# Patient Record
Sex: Male | Born: 1935 | Race: Black or African American | Hispanic: No
Health system: Southern US, Community
[De-identification: ages and names within clinical notes are randomized; demographics above are authoritative.]

---

## 2004-02-29 ENCOUNTER — Emergency Department (HOSPITAL_COMMUNITY): Admission: EM | Admit: 2004-02-29 | Discharge: 2004-02-29 | Payer: Self-pay | Admitting: Emergency Medicine

## 2004-05-11 ENCOUNTER — Ambulatory Visit: Payer: Self-pay | Admitting: Family Medicine

## 2004-06-10 ENCOUNTER — Ambulatory Visit: Payer: Self-pay | Admitting: Family Medicine

## 2004-08-12 ENCOUNTER — Ambulatory Visit: Payer: Self-pay | Admitting: Family Medicine

## 2004-11-16 ENCOUNTER — Ambulatory Visit: Payer: Self-pay | Admitting: Family Medicine

## 2004-12-28 ENCOUNTER — Ambulatory Visit: Payer: Self-pay | Admitting: Family Medicine

## 2005-01-20 ENCOUNTER — Ambulatory Visit: Payer: Self-pay | Admitting: Internal Medicine

## 2005-02-15 ENCOUNTER — Ambulatory Visit: Payer: Self-pay | Admitting: Internal Medicine

## 2005-03-01 ENCOUNTER — Ambulatory Visit: Payer: Self-pay | Admitting: Family Medicine

## 2005-07-22 ENCOUNTER — Ambulatory Visit: Payer: Self-pay | Admitting: Family Medicine

## 2005-10-20 ENCOUNTER — Ambulatory Visit: Payer: Self-pay | Admitting: Family Medicine

## 2006-02-28 ENCOUNTER — Ambulatory Visit: Payer: Self-pay | Admitting: Family Medicine

## 2006-03-04 ENCOUNTER — Ambulatory Visit: Payer: Self-pay | Admitting: Family Medicine

## 2006-03-09 ENCOUNTER — Ambulatory Visit: Payer: Self-pay | Admitting: Family Medicine

## 2006-04-07 ENCOUNTER — Ambulatory Visit: Admission: RE | Admit: 2006-04-07 | Discharge: 2006-04-07 | Payer: Self-pay | Admitting: Family Medicine

## 2006-04-11 ENCOUNTER — Ambulatory Visit: Payer: Self-pay | Admitting: Family Medicine

## 2006-06-08 ENCOUNTER — Ambulatory Visit: Payer: Self-pay | Admitting: Family Medicine

## 2006-06-09 ENCOUNTER — Encounter: Payer: Self-pay | Admitting: Family Medicine

## 2006-06-09 LAB — CONVERTED CEMR LAB
Hgb A1c MFr Bld: 11.7 % — ABNORMAL HIGH (ref 4.6–6.1)
Microalb, Ur: 1.44 mg/dL (ref 0.00–1.89)
PSA: 1.44 ng/mL (ref 0.10–4.00)
TSH: 1.292 microintl units/mL (ref 0.350–5.50)

## 2006-06-10 ENCOUNTER — Ambulatory Visit: Payer: Self-pay | Admitting: Family Medicine

## 2006-07-20 ENCOUNTER — Ambulatory Visit: Payer: Self-pay | Admitting: Family Medicine

## 2006-09-06 ENCOUNTER — Ambulatory Visit: Payer: Self-pay | Admitting: Family Medicine

## 2006-09-06 LAB — CONVERTED CEMR LAB
BUN: 32 mg/dL — ABNORMAL HIGH (ref 6–23)
CO2: 22 meq/L (ref 19–32)
Calcium: 9 mg/dL (ref 8.4–10.5)
Chloride: 107 meq/L (ref 96–112)
Creatinine, Ser: 1.23 mg/dL (ref 0.40–1.50)
Glucose, Bld: 278 mg/dL — ABNORMAL HIGH (ref 70–99)
Hgb A1c MFr Bld: 11.3 % — ABNORMAL HIGH (ref 4.6–6.1)
Potassium: 4.5 meq/L (ref 3.5–5.3)
Sodium: 141 meq/L (ref 135–145)

## 2006-10-06 ENCOUNTER — Ambulatory Visit: Payer: Self-pay | Admitting: Family Medicine

## 2006-10-18 ENCOUNTER — Ambulatory Visit: Payer: Self-pay | Admitting: Family Medicine

## 2006-11-28 ENCOUNTER — Ambulatory Visit: Payer: Self-pay | Admitting: Family Medicine

## 2006-12-06 ENCOUNTER — Ambulatory Visit: Payer: Self-pay | Admitting: Family Medicine

## 2006-12-06 LAB — CONVERTED CEMR LAB
BUN: 49 mg/dL — ABNORMAL HIGH (ref 6–23)
CO2: 27 meq/L (ref 19–32)
Calcium: 9.2 mg/dL (ref 8.4–10.5)
Chloride: 105 meq/L (ref 96–112)
Creatinine, Ser: 1.49 mg/dL (ref 0.40–1.50)
Glucose, Bld: 204 mg/dL — ABNORMAL HIGH (ref 70–99)
Potassium: 4.6 meq/L (ref 3.5–5.3)
Sodium: 139 meq/L (ref 135–145)

## 2007-01-31 ENCOUNTER — Ambulatory Visit: Payer: Self-pay | Admitting: Family Medicine

## 2007-02-01 ENCOUNTER — Encounter: Payer: Self-pay | Admitting: Family Medicine

## 2007-02-01 LAB — CONVERTED CEMR LAB
ALT: 24 units/L (ref 0–53)
AST: 19 units/L (ref 0–37)
Albumin: 3.8 g/dL (ref 3.5–5.2)
Alkaline Phosphatase: 88 units/L (ref 39–117)
BUN: 41 mg/dL — ABNORMAL HIGH (ref 6–23)
Basophils Absolute: 0 10*3/uL (ref 0.0–0.1)
Basophils Relative: 0 % (ref 0–1)
Bilirubin, Direct: 0.1 mg/dL (ref 0.0–0.3)
CO2: 23 meq/L (ref 19–32)
Calcium: 8.8 mg/dL (ref 8.4–10.5)
Chloride: 107 meq/L (ref 96–112)
Cholesterol: 121 mg/dL (ref 0–200)
Creatinine, Ser: 1.49 mg/dL (ref 0.40–1.50)
Eosinophils Absolute: 0.3 10*3/uL (ref 0.0–0.7)
Eosinophils Relative: 3 % (ref 0–5)
Glucose, Bld: 230 mg/dL — ABNORMAL HIGH (ref 70–99)
HCT: 44 % (ref 39.0–52.0)
HDL: 43 mg/dL (ref >39)
Hemoglobin: 13.6 g/dL (ref 13.0–17.0)
Indirect Bilirubin: 0.5 mg/dL (ref 0.0–0.9)
LDL Cholesterol: 65 mg/dL (ref 0–99)
Lymphocytes Relative: 23 % (ref 12–46)
Lymphs Abs: 2.3 10*3/uL (ref 0.7–3.3)
MCHC: 30.9 g/dL (ref 30.0–36.0)
MCV: 88.2 fL (ref 78.0–100.0)
Monocytes Absolute: 0.9 10*3/uL — ABNORMAL HIGH (ref 0.2–0.7)
Monocytes Relative: 9 % (ref 3–11)
Neutro Abs: 6.6 10*3/uL (ref 1.7–7.7)
Neutrophils Relative %: 66 % (ref 43–77)
Platelets: 272 10*3/uL (ref 150–400)
Potassium: 4.2 meq/L (ref 3.5–5.3)
RBC: 4.99 M/uL (ref 4.22–5.81)
RDW: 17.2 % — ABNORMAL HIGH (ref 11.5–14.0)
Sodium: 142 meq/L (ref 135–145)
Total Bilirubin: 0.6 mg/dL (ref 0.3–1.2)
Total CHOL/HDL Ratio: 2.8
Total Protein: 7.7 g/dL (ref 6.0–8.3)
Triglycerides: 65 mg/dL (ref <150)
VLDL: 13 mg/dL (ref 0–40)
WBC: 10.1 10*3/uL (ref 4.0–10.5)

## 2007-03-15 ENCOUNTER — Ambulatory Visit: Payer: Self-pay | Admitting: Family Medicine

## 2007-03-28 ENCOUNTER — Ambulatory Visit: Payer: Self-pay | Admitting: Family Medicine

## 2007-05-04 ENCOUNTER — Ambulatory Visit: Payer: Self-pay | Admitting: Family Medicine

## 2007-05-22 ENCOUNTER — Ambulatory Visit: Payer: Self-pay | Admitting: Family Medicine

## 2007-06-07 ENCOUNTER — Inpatient Hospital Stay (HOSPITAL_COMMUNITY): Admission: EM | Admit: 2007-06-07 | Discharge: 2007-06-12 | Payer: Self-pay | Admitting: Emergency Medicine

## 2007-06-09 ENCOUNTER — Ambulatory Visit: Payer: Self-pay | Admitting: Gastroenterology

## 2007-06-10 ENCOUNTER — Ambulatory Visit: Payer: Self-pay | Admitting: Gastroenterology

## 2007-06-27 ENCOUNTER — Inpatient Hospital Stay (HOSPITAL_COMMUNITY): Admission: EM | Admit: 2007-06-27 | Discharge: 2007-07-07 | Payer: Self-pay | Admitting: Emergency Medicine

## 2007-07-01 ENCOUNTER — Ambulatory Visit: Payer: Self-pay | Admitting: Internal Medicine

## 2007-07-03 ENCOUNTER — Encounter: Payer: Self-pay | Admitting: Internal Medicine

## 2007-07-03 ENCOUNTER — Ambulatory Visit: Payer: Self-pay | Admitting: Internal Medicine

## 2007-11-07 ENCOUNTER — Ambulatory Visit: Payer: Self-pay | Admitting: Family Medicine

## 2007-11-07 LAB — CONVERTED CEMR LAB
BUN: 22 mg/dL (ref 6–23)
Basophils Absolute: 0.1 10*3/uL (ref 0.0–0.1)
Basophils Relative: 1 % (ref 0–1)
CO2: 25 meq/L (ref 19–32)
Calcium: 9.4 mg/dL (ref 8.4–10.5)
Chloride: 98 meq/L (ref 96–112)
Creatinine, Ser: 0.85 mg/dL (ref 0.40–1.50)
Eosinophils Absolute: 0.3 10*3/uL (ref 0.0–0.7)
Eosinophils Relative: 4 % (ref 0–5)
Glucose, Bld: 155 mg/dL — ABNORMAL HIGH (ref 70–99)
HCT: 34.9 % — ABNORMAL LOW (ref 39.0–52.0)
Hemoglobin: 10.9 g/dL — ABNORMAL LOW (ref 13.0–17.0)
Lymphocytes Relative: 18 % (ref 12–46)
Lymphs Abs: 1.3 10*3/uL (ref 0.7–4.0)
MCHC: 31.2 g/dL (ref 30.0–36.0)
MCV: 73.9 fL — ABNORMAL LOW (ref 78.0–100.0)
Monocytes Absolute: 0.6 10*3/uL (ref 0.1–1.0)
Monocytes Relative: 8 % (ref 3–12)
Neutro Abs: 5.1 10*3/uL (ref 1.7–7.7)
Neutrophils Relative %: 69 % (ref 43–77)
Platelets: 505 10*3/uL — ABNORMAL HIGH (ref 150–400)
Potassium: 4.6 meq/L (ref 3.5–5.3)
RBC: 4.72 M/uL (ref 4.22–5.81)
RDW: 22.3 % — ABNORMAL HIGH (ref 11.5–15.5)
Sodium: 133 meq/L — ABNORMAL LOW (ref 135–145)
WBC: 7.4 10*3/uL (ref 4.0–10.5)

## 2007-11-10 DIAGNOSIS — E785 Hyperlipidemia, unspecified: Secondary | ICD-10-CM | POA: Insufficient documentation

## 2007-11-10 DIAGNOSIS — D539 Nutritional anemia, unspecified: Secondary | ICD-10-CM | POA: Insufficient documentation

## 2007-11-10 DIAGNOSIS — I10 Essential (primary) hypertension: Secondary | ICD-10-CM | POA: Insufficient documentation

## 2007-11-10 DIAGNOSIS — N189 Chronic kidney disease, unspecified: Secondary | ICD-10-CM | POA: Insufficient documentation

## 2007-11-10 DIAGNOSIS — E669 Obesity, unspecified: Secondary | ICD-10-CM | POA: Insufficient documentation

## 2007-11-10 DIAGNOSIS — E109 Type 1 diabetes mellitus without complications: Secondary | ICD-10-CM | POA: Insufficient documentation

## 2008-04-04 IMAGING — CR DG CHEST 1V PORT
1 series · 1 of 1 positions shown · non-contrast
Comparison: 06/09/07.

CLINICAL DATA: Hypoglycemia.  
 PORTABLE CHEST ? 1 VIEW:

[view not recorded]
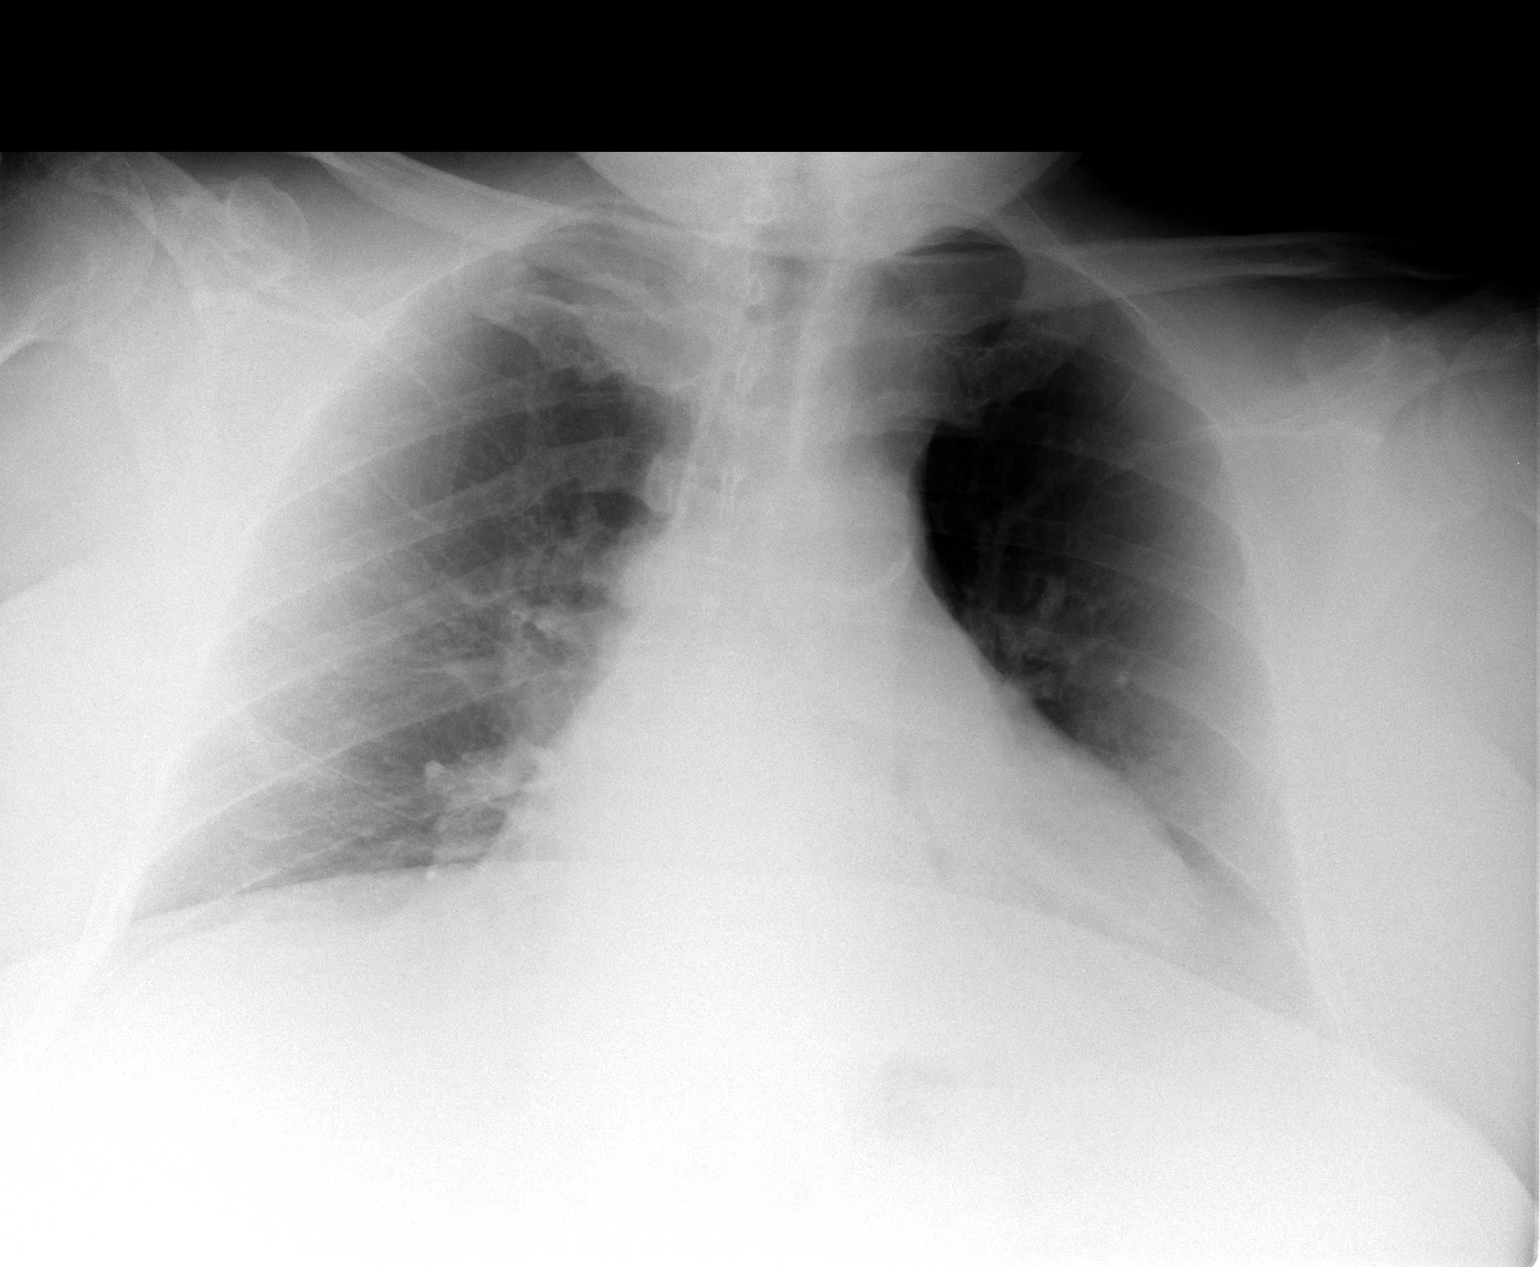

[1 of 1 positions shown; findings below may reference images not displayed]

FINDINGS: The patient is somewhat lordotic in position.  The heart is mildly enlarged considering AP projection.  There is no frank congestive heart failure or pneumonia in one view.
IMPRESSION: Cardiomegaly ? no definite acute process.

## 2008-04-05 IMAGING — CR DG CHEST 1V PORT SAME DAY
1 series · 1 of 1 positions shown · non-contrast
Comparison: Same day.

CLINICAL DATA: PICC placement.
 PORTABLE CHEST:

[view not recorded]
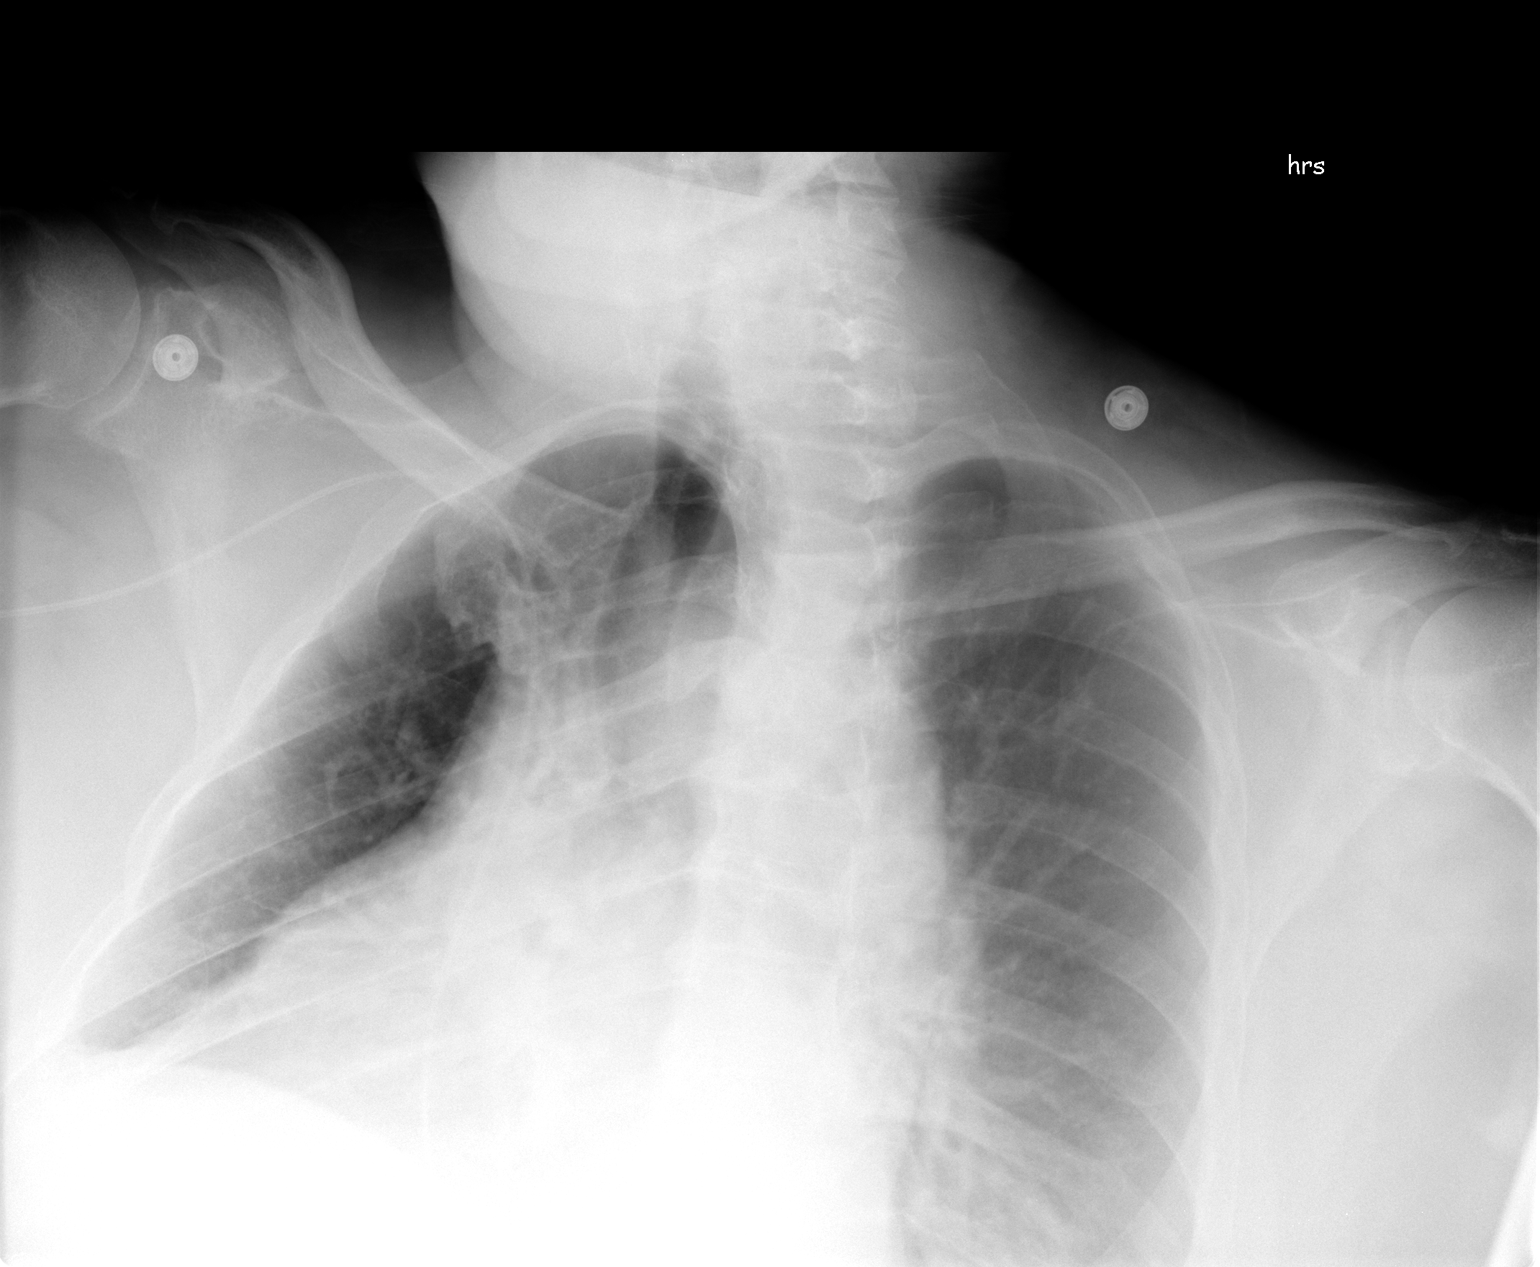

[1 of 1 positions shown; findings below may reference images not displayed]

FINDINGS: Right PICC extends beyond the inferior boundary of the film.  Left costophrenic angle is not included on the film.  There may be a left pleural effusion.
IMPRESSION: Right PICC extends beyond the inferior boundary of the film.  Left pleural effusion.

## 2008-04-05 IMAGING — CR DG CHEST 1V PORT SAME DAY
1 series · 1 of 1 positions shown · non-contrast
Comparison: 06/28/07

CLINICAL DATA: PICC line placement. 
 PORTABLE CHEST - 1 VIEW ? 06/28/07 AT 1677 HOURS:

[view not recorded]
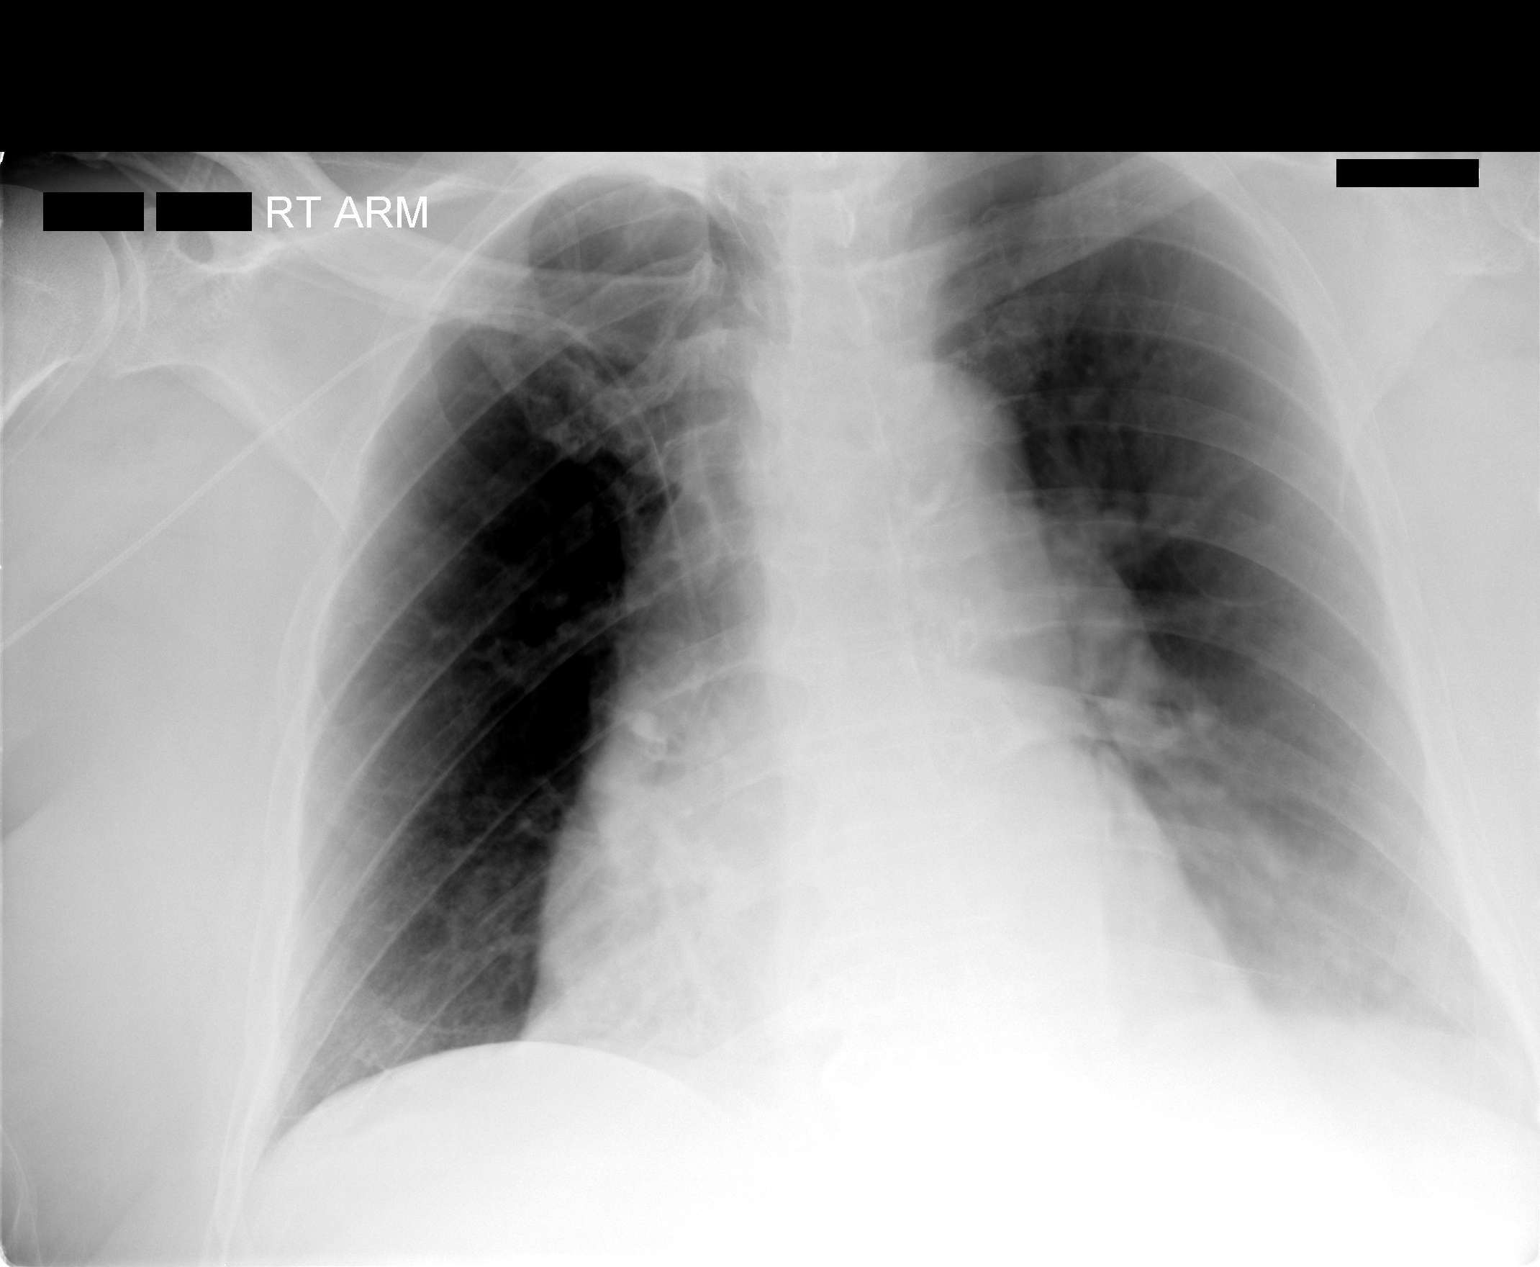

[1 of 1 positions shown; findings below may reference images not displayed]

FINDINGS: Compared to an earlier film, the PICC line has been withdrawn with the tip now in the mid SVC.  No pneumothorax is seen.  Heart size is stable.
IMPRESSION: Right PICC tip in mid SVC.  No pneumothorax.

## 2010-10-13 NOTE — Op Note (Signed)
NAME:  [name] Olsen, [name] Olsen NO.:  [id number]   MEDICAL RECORD NO.:  [id number]          PATIENT TYPE:  INP   LOCATION:  A326                          FACILITY:  [facility]   PHYSICIAN:  R. Roetta Sessions, M.D. DATE OF BIRTH:  11-23-1926   DATE OF PROCEDURE:  07/03/2007  DATE OF DISCHARGE:                               OPERATIVE REPORT   Esophagogastroduodenoscopy with esophageal biopsy, KOH prep followed by  colonoscopy, biopsy.   INDICATIONS FOR PROCEDURE:  77 year old gentleman numerous medical  problems been in hospital with anemia and Hemoccult positive stool.  He  is anorexic.  EGD colonoscopy now being done.  This approach has been  discussed the patient at length previously and again at the bedside.  Risks, benefits and alternatives have been reviewed.  Please see  documentation medical record.  Please see Dr. Cira Servant January consultation  for more information.   PROCEDURE NOTE:  This was a technically challenging exam.  The patient  was not mobile and was in a bariatric bed.  Conscious sedation Versed 2  mg IV.  Both procedures Pentax video chip system.  Cetacaine spray for  topical pharyngeal anesthesia.  The patient was placed lateral decubitus  position.   EGD FINDINGS:  Examination of the tubular esophagus revealed two-thirds  of the distal esophagus denuded markedly inflamed friable.  There was no  normal mucosa.  Distal 2/3 tubular esophagus there was a fairly well  demarcated zone at approximately where the proximal 1/3 appeared normal.  There was some minimal exudate but no obvious plaques.  Please see  multiple photos taken, no obvious tumor.  EG junction easily traversed.  Stomach:  Gastric cavity was emptied insufflated well with air.  Thorough examination gastric mucosa including retroflex view of proximal  stomach esophagogastric junction demonstrated only a small hiatal  hernia.  Pylorus patent, easily traversed.  Examination bulb, second  portion  revealed no abnormalities.   THERAPEUTIC/DIAGNOSTIC MANEUVERS PERFORMED:  KOH prep esophageal mucosa  were taken also biopsies for histology were taken.   The patient tolerated the procedure well was prepared for colonoscopy.  Again this was a challenge because of lack of mobility and redundancy of  the colon.  Digital rectal exam revealed no abnormalities.  Prep was  marginal to poor.   FINDINGS:  Examination of the colon was undertaken from the rectosigmoid  junction through the left, transverse, right colon to area of  appendiceal orifice, ileocecal valve and cecum.  These structures were  seen and photographed for the record.  From this level scope was  withdrawn.  Previously mentioned mucosal surfaces were again seen.  The  patient's colon was elongated and redundant, external abdominal pressure  had to be applied to reach the cecum.  The area about the cecum,  ileocecal valve was ulcerated.  Mucosa was partially obscured by stool.  I did not see any neoplastic process.  This area was biopsied.  The  patient had extensive left-sided diverticula.  I did not see any obvious  polyp or neoplasm.  Poor prep compromised exam.  Nor did I see any  ulcers in the  more distal colon down the rectosigmoid junction.  However, the rectosigmoid, numerous ulcers were identified.  The largest  that I saw was approximately 1 cm.  Please see photos.  Because of the  angle of the patient in the low part of bariatric bed I was unable to  retroflex but I was able to biopsy one of the ulcers in the rectum as  well.  The patient tolerated both procedures very well was reacted in  endoscopy.   IMPRESSION:  1. EGD markedly inflamed distal 2/3 of tubular esophagus as described      above, consistent with severe esophagitis, possibly reflux.  We      will rule out of viral and fungal esophagitis less likely, biopsied      KOH pending.  2. Hiatal hernia otherwise normal gastric mucosa, normal D1, D2.    Colonoscopy findings:  Poor prep patient positioning and bed made exam  much more difficult, the patient a long redundant colon.  He had ulcers  in the rectum and about the cecum, ileocecal valve of uncertain  significance biopsied.  Left-sided diverticula.  No obvious polyp or  neoplasm.  Exam was compromised by the poor prep and technical  difficulties as outlined above.   RECOMMENDATIONS:  1. Increase Protonix to 40 grams orally every 12 hours.  2. Add Carafate 1 gram slurry q.i.d.  3. Follow-up on path.  4. Allow carbohydrate modified low residue renal diet.   I discussed my findings with the patient's son, will make further  recommendations in the very near future.      Jonathon Bellows, M.D.  Electronically Signed     RMR/MEDQ  D:  07/03/2007  T:  07/04/2007  Job:  [id number]   cc:   Jorja Loa, M.D.  Fax: [phone]   hospitalist team

## 2010-10-13 NOTE — Group Therapy Note (Signed)
NAME:  [name] Olsen, [name] Olsen NO.:  [id number]   MEDICAL RECORD NO.:  [id number]          PATIENT TYPE:  INP   LOCATION:  A203                          FACILITY:  [facility]   PHYSICIAN:  Skeet Latch, DO    DATE OF BIRTH:  1927-04-03   DATE OF PROCEDURE:  06/11/2007  DATE OF DISCHARGE:                                 PROGRESS NOTE   SUBJECTIVE:  [name] Olsen seems to be doing well and improving at this  time.  Patient does not complain of any shortness of breath, chest pain,  abdominal or genitourinary complaints today.  Patient wanting to be  discharged.   OBJECTIVE:  VITAL SIGNS:  Temperature 98.5, pulse 74, respirations 24,  blood pressure 149/64.  LUNGS:  Clear to auscultation bilaterally.  No wheezing, rhonchi or  rales.  CARDIOVASCULAR:  Regular rate and rhythm, no murmurs, rubs, or gallops.  ABDOMEN:  Obese, soft, nontender, nondistended, positive bowel sounds.  No rigidity or guarding.  EXTREMITIES:  Chronic changes noted in lower extremities with pedal  edema.   LABORATORY DATA:  Labs are pending at this time.   ASSESSMENT:  1. Renal failure, acute on chronic.  His renal function seems to be      improving daily.  Patient continues to be on IV hydration and      continues to be on Lasix.  I will change his Lasix to p.o. at this      time.  His renal ultrasound did show bilateral hydronephrosis.  2. Hyperkalemia.  Seemed to have resolved.  His potassium seems to be      stable at this time.  3. Metabolic acidosis.  This is also improving.  Patient still on      bicarb orally at this time.  4. Normocytic anemia.  Patient was found to be iron deficient and iron      supplement was begun yesterday.  Patient is also Hemoccult positive      and has been followed by gastroenterology.  They recommended an      outpatient colonoscopy/esophagogastroduodenoscopy.  Patient's      Lovenox has been discontinued and he has been placed on compression      devices.   Anticipate patient being discharged in the next 24 hours if stable.      Skeet Latch, DO  Electronically Signed     SM/MEDQ  D:  06/11/2007  T:  06/11/2007  Job:  [id number]

## 2010-10-13 NOTE — Procedures (Signed)
NAME:  JARL, SELLITTO NO.:  [id number]   MEDICAL RECORD NO.:  [id number]          PATIENT TYPE:  INP   LOCATION:  A203                          FACILITY:  [facility]   PHYSICIAN:  Dani Gobble, MD       DATE OF BIRTH:  [birth year]   DATE OF PROCEDURE:  06/08/2007  DATE OF DISCHARGE:                                ECHOCARDIOGRAM   INDICATIONS:  A 77 year old gentleman with a past medical history of  hypertension and diabetes, admitted with weakness and acute renal  failure and referred for evaluation of LV function.   The technical quality of this study is severely limited secondary to  patient body habitus, tachycardic heart rate, ectopy and bundle branch  block.  My recommendation would be to repeat this once his heart rate is  slower and a bit less chaotic.   The aorta is not well visualized.   The left atrium appears grossly normal in size.  The patient was in mild  sinus tachycardia with frequent ectopic beats and bundle branch block.   None of the valves were well visualized, but no gross abnormality was  noted.  The left ventricular size appeared to be normal.  Comment  regarding systolic function and ejection fraction is quite difficult due  to the technical limitations list above.  I do not suspect severe left  ventricular dysfunction, but having said that, it would be difficult to  give an estimate of the ejection fraction on this study.  The right side  was not well visualized.  There does appear to be a small  circumferential pericardial effusion versus thickening of the  pericardium.  I do not suspect tamponade hemodynamics, although not all  parameters obviously were able to be assessed.  Again, I would recommend  a repeat study once his heart rate is diminished and ectopy is less.           ______________________________  Dani Gobble, MD     AB/MEDQ  D:  06/08/2007  T:  06/08/2007  Job:  [id number]

## 2010-10-13 NOTE — H&P (Signed)
NAME:  [name] Olsen, RACHAL NO.:  [id number]   MEDICAL RECORD NO.:  [id number]          PATIENT TYPE:  INP   LOCATION:  A326                          FACILITY:  [facility]   PHYSICIAN:  Skeet Latch, DO    DATE OF BIRTH:  1926-07-12   DATE OF ADMISSION:  06/27/2007  DATE OF DISCHARGE:  LH                              HISTORY & PHYSICAL   PRIMARY CARE PHYSICIAN:  Dr. Lodema Hong.   CHIEF COMPLAINT:  1. Hypoglycemia.  2. Lower extremity edema.   HISTORY OF PRESENT ILLNESS:  [name] Olsen is a 77 year old African-  American male who was recently discharged on June 12, 2007, for renal  failure, anemia, leukocytosis, pedal edema, elevated troponins.  Patient  presents with family members who state that he was found this morning to  have a blood sugar of 30 and be slightly unresponsive with slurred  speech.  Also of note, family members state for the past week or so the  patient has been having increasing edema in his lower extremities and  having skin breakdown on his legs and his sacral area.   EXAM:  Patient is sleeping but he is easily arousable, does not appear  in any acute distress.  Patient does have some type of home health  caregiver that comes on a daily basis and states that over the past week  patient's edema has worsened and he has been complaining of back pain.   PAST MEDICAL HISTORY:  1. Hypertension.  2. Diabetes.  3. Hyperlipidemia.  4. Obesity.  5. Osteoarthritis.  6. Chronic renal failure.  7. Anemia.  8. Chronic pedal edema.  9. Elevated troponin.   PAST SURGICAL HISTORY:  None.   SOCIAL HISTORY:  Denies any alcohol, tobacco or illicit drug use.   ALLERGIES:  No known drug allergies.   REVIEW OF SYSTEMS:  HEENT:  Unremarkable.  RESPIRATORY:  No cough,  dyspnea or wheezing.  GI:  No nausea, vomiting, diarrhea.  GENITOURINARY:  No urgency, dysuria or frequency.  MUSCULOSKELETAL:  Complains of back pain and leg pain.  SKIN:  Positive for edema  with  skin breakdown.  NEUROLOGIC:  No headaches, no mental status changes.  CARDIOVASCULAR:  No chest pain or palpitations.   CURRENT HOME MEDICATIONS:  1. Byetta 10 units subcu at breakfast and supper.  2. Humulin 70/30, 75 units in the morning, 40 units in the p.m.  3. Ferrous sulfate 325 mg daily.  4. Simvastatin 40 mg at bedtime.  5. Cyclobenzaprine 10 mg __________ .  6. Furosemide 20 mg twice a day.  7. Diltiazem 120 mg daily.  8. Glimepiride 4 mg daily.  9. Aspirin 81 mg daily.  10.Benazepril HCT 20/12.5, two tabs daily.   PHYSICAL EXAM:  VITAL SIGNS:  Temperature is 94.5, pulse 80,  respirations 20, blood pressure 90/57, sating 97%.  HEENT:  Head is atraumatic, normocephalic.  Eyes are PERRLA, EOMI.  NECK:  Soft, supple, nontender, nondistended, no JVD, no thyromegaly.  CARDIOVASCULAR:  Distant heart sounds, S1 and S2 is regular, no murmurs.  LUNGS:  Decreased but clear.  No rales, rhonchi or wheezing.  ABDOMEN:  Obese, soft, nontender, nondistended, positive bowel sounds,  no rigidity or guarding.  EXTREMITIES:  He has bilateral chronic changes noted with severe pedal  edema in his lower extremities and feet.  He has multiple skin  breakdowns noted.  Left lower extremity has fluctuant areas of edema  noted, has onychomycosis on his toes.  SKIN:  His buttock area, sacral area has multiple skin breakdowns in  chronic stages of healing.  Some serosanguineous fluid is noted on his  sacral area as well as his lower extremities.  NEUROLOGICALLY:  Cranial nerves II-XII are grossly intact.  Patient  sleeping but alert.   LABS:  His urine microscopic 11-20 WBCs, 7-10 RBCs.  Urinalysis is  clear, a small amount of leukocytes, no nitrates.  Sodium 133, potassium  5.1, chloride 100, CO2 is 19, glucose 71.  BUN 143, creatinine 5.4.  White count 13.5, hemoglobin 10.1, hematocrit 30.7, platelet count  381,000.   ASSESSMENT:  1. Cellulitis with skin breakdown.  2. Chronic pedal  edema.  3. Acute on chronic renal failure.  4. Diabetes.  5. Hyponatremia.  6. Leukocytosis.  7. Anemia.   PLAN:  1. For his cellulitis with skin breakdown, the patient was empirically      placed on vancomycin, Pharmacy to dose.  Patient will also get a      Wound Care consult regarding multiple skin breakdowns to prevent      sacral decubitus and to treat his leg wounds.  Will await cultures      at this time.  2. For his acute on chronic renal failure will get a Nephrology      consult.  Patient was followed by Nephrology on a previous visit.  3. For his diabetes patient will be placed on a sliding scale, blood      sugars will be checked before meals and on retiring.  Will hold on      his oral hypoglycemics at this time.  4. For his leukocytosis will continue to follow.  Will await culture      results.  5. For his anemia it seems to be chronic in nature.  Will just      continue to follow his hemoglobin and hematocrits.  6. For his pedal edema, this is chronic in nature.  Will add a BNP to      his a.m. labs.  The patient has no history of CHF, will rule out      that with a BNP and a 2D echo.      Skeet Latch, DO  Electronically Signed     SM/MEDQ  D:  06/27/2007  T:  06/27/2007  Job:  [id number]

## 2010-10-13 NOTE — Discharge Summary (Signed)
NAME:  [name] Olsen, [name] Olsen NO.:  [id number]   MEDICAL RECORD NO.:  [id number]          PATIENT TYPE:  INP   LOCATION:  A326                          FACILITY:  [facility]   PHYSICIAN:  Dorris Singh, DO    DATE OF BIRTH:  11/15/1926   DATE OF ADMISSION:  06/27/2007  DATE OF DISCHARGE:  02/06/2009LH                               DISCHARGE SUMMARY   ADMISSION DIAGNOSES:  1. Cellulitis with skin breakdown.  2. Chronic pedal edema.  3. Acute on chronic renal failure.  4. Diabetes.  5. Hyponatremia.  6. Leukocytosis.  7. Anemia.   DISCHARGE DIAGNOSES:  1. Cellulitis with skin breakdown.  2. Sacral decubitus ulcers.  3. Chronic pedal edema with elephantiasis.  4. Chronic renal failure.  5. Diabetes.  6. Anemia.   PRIMARY CARE PHYSICIAN:  Dr. Lodema Hong.   CONSULTATIONS:  He had several consults which include Dr. Jena Gauss, Dr.  Kristian Covey, physical therapy, and Dr. Malvin Johns.   PROCEDURES:  He had several tests done.  One includes on January 27  portable chest which showed cardiomegaly, on January 28 he had another  portable chest with a rotating exam with question of bibasilar  infiltrate to the right arm PICC line and right subclavian vein, and on  January 28 also he had a chest port view, right PICC line and the mid  subclavian, no pneumothorax, and had a repeat which showed right PICC  line extends beyond the inferior boundary of the film, left pleural  effusion.  He had an operative report for an EGD on February 2.  The  patient was also seen by wound care.   His H&P was done by Dr. Lilian Kapur.  To summarize, [name] Olsen is a 77-  year-old African-American male who was recently discharged on January 12  for renal failure nemo leukocytosis and pedal edema and elevated  troponin.  He was found this morning to have a blood sugar of 30 and to  be slightly unresponsive, with slurred speech.  The patient has also  been complaining of increasing edema in his skin in his  lower  extremities and having breakdown in his lower legs and sacral area.  He  has not been mobile.  He was admitted for cellulitis of lower  extremities with skin breakdown.  He was placed on Vancomycin.  The  patient also was seen by wound care for is multiple breakdowns and to  prevent sacral decubitus ulcers.  He also has chronic renal failure.  Due to his renal failure, the patient was seen by nephrology.  Also he  was placed on a sliding scale and he was continued on antibiotics.  The  patient then was seen by several consultants.  The patient was started  on Vancomycin on admission and pharmacy monitored that.  He was then  seen by social services and on the 28th a PICC line was started due to  the chronic nature of the cellulitis and the need for prolonged  antibiotic therapy.  He was also seen by Dr. Kristian Covey who continued to  follow him, as well as nutritional assessment who stated that  his goal  should be to increase his p.o. intake to 75% of meals, to consume  adequate energy and proteins to meet his estimated needs and healing of  his skin.  Wound care also saw him and they have recommended that the  patient currently has some drainage from his penis and they cultured  that.  I have not seen the results of that but we will examine it if it  continues, to have a urology followup, though the patient is on  Vancomycin so that should cover it.  This was on January 28.  Social  services was brought in to the case and would take about a skilled  nursing facility.  Surgery also saw the patient, recommended to try to  control and keep the cellulitis and the wound as clean and dry as  possible.  The patient is to try to keep him from having a bilateral  amputation.  Nutrition continued to follow the patient as well as  nephrology.  His acute renal failure was improving.  His anemia was  stable.  His H&H remained stable.  GI saw him for his anemia.  They then  decided to do an EGD and  a colonoscopy.  On February 2, he had his  colonoscopy.  We will follow up with the pathology report, begin a  b.i.d. PPI, Carafate and low residual carbohydrate modified diet, renal  diet.  Nutrition continued to follow him.  It seems like he was meeting  his prescribed goal.  Also, he was started on Nu Iron for his anemia.  On February 3, his lower extremity cellulitis with chronic wounds were  remaining stable, however he continued to be on Vancomycin and Zosyn.  His acute renal failure was improving and his anemia with his hema  positive stools, he was transfused a couple of units, and his hemoglobin  has remained stable at 9.3.  He did have an EGD and a colonoscopy and  still waiting for biopsy results.  He has insulin-dependent diabetes  mellitus.  He has required adjustment of his Lantus to get his blood  sugars further controlled, because he is prone with hypoglycemia with  the 70/30 that was discontinued.  The patient was malnourished and this  is why he was getting nutritional supplementation.  He has a sacral  decubitus ulcer stage 2 which is getting wound care, and his  malnourishment is improving with nutrition on the case.  GI signed off.  There was some concern about some colonic ulcers that the patient had  after receiving a medication when he was here in January due to  elevated, and he has been recommended to avoid Kayexalate due to these  ulcers if his INR is not therapeutic.  The patient has been for his  NovoLog a sliding scale of 6 units t.i.d. from dietary teaching, and  they recommend that the patient be on the PPI.  At this point in time,  we will go ahead and discharge the patient.  We have found a placement  for him.  He has remained stable the last  couple of days, without any  complains.  No chest pain, shortness of breath, abdominal pain, nausea  or vomiting.  He knows that he will be placed to help with his wound  care.  At this point in time, the patient  does not have any concerns or  complaints.   DISPOSITION:  To a nursing home.   CONDITION:  Stable.   We  will send him there on several medications.   He has a new allergy to Kayexalate so he should not receive that for any  means.  He does have colonic ulcers.  He will go home on:  1. Biette 10 units subcutaneous breakfast and supper.  2. Humulin 70/30 75 units every morning, 40 units every night.  3. Ferrous sulfate 325 daily.  4. Simvastatin 40 mg at bedtime.  5. Cyclobenzaprine 10 mg at bedtime.  6. Diltiazem 120 mg daily.  7. Glimepiride 4 mg daily.  8. Aspirin 81 mg daily,  9. Benazepril HCTZ 20/12.5.  10.Protonix 40 mg p.o. b.i.d.  11.Megace 40 mg tablet p.o. daily.  12.I will discontinue the Lasix  13.Demadex 20 mg once a day.  14.KCl 20 mg p.o. daily.  15.Also the patient can be on Dilaudid 1-2 mg IV q.4h p.r.n. pain,      Tylenol 650 mg p.o. every 4 hours p.r.n. pain.  16.Vancomycin 1250 mg IV q.24h.  He will do this for the next 2 weeks.      This can be assessed by the physician at the facility.  17.Also the patient is on Zosyn 3.375 mg IV every 8 hours.  18.He can have Xopenex nebs 0.3 mg t.i.d. p.r.n.   He will go the nursing home on these medications, and he is stable to  go.   Recommend wound care while he is there because the patient cannot sit up  or transfer or walk.      Dorris Singh, DO  Electronically Signed     CB/MEDQ  D:  07/07/2007  T:  07/07/2007  Job:  [id number]

## 2010-10-13 NOTE — Consult Note (Signed)
NAME:  [name] Olsen, [name] Olsen NO.:  [id number]   MEDICAL RECORD NO.:  [id number]          PATIENT TYPE:  INP   LOCATION:  A203                          FACILITY:  [facility]   PHYSICIAN:  Kassie Mends, M.D.      DATE OF BIRTH:  Feb 03, 1927   DATE OF CONSULTATION:  DATE OF DISCHARGE:                                 CONSULTATION   REQUESTING PHYSICIAN:  [facility].   REASON FOR CONSULTATION:  Hemoccult-positive stool, anemia.   HISTORY OF PRESENT ILLNESS:  [name] Olsen is a 77 year old male.  He has  a history of diabetes and hypertension.  He is pretty much bedridden at  home although he does occasionally get up to ambulate with a walker but  mostly uses a wheelchair.  He notes that he was getting up from the  commode.  He had an episode of profound weakness, especially to his  lower extremities, when trying to get to his wheelchair and fell.  He  was admitted with hyperkalemia, acute renal failure, and metabolic  acidosis.  Upon admission, his hemoglobin was 11.5.  It is 10.2 today  with an MCV of 84.  He has a creatinine of 2.6 today.  He denies any  rectal bleeding or melena.  He generally has a bowel movement every day  if not every other day.  He denies any constipation or diarrhea.  He  does have a hard time getting to the bathroom given his mobility issues  and profound weakness.  He denies any history of anemia.  He does take  an aspirin 81 mg daily at home but denies any other NSAID use.  He is  diabetic, is on insulin.  He has been started on Kayexalate since he was  admitted as well as Lovenox and Protonix.  He denies any nausea or  vomiting.  Denies any heartburn.  He has occasional  indigestion,  especially when he lies down right after eating.  He denies any  dysphagia, odynophagia.  Denies any anorexia or early satiety.  He  thinks he may have lost a few pounds but he is not sure.  He lives at  home with his son.  He does have a nurse who comes during the  day for 2  hours.  He does have occasional cramp-like low abdominal pain which is  intermittent and mild.  He has never had a colonoscopy.   PAST MEDICAL/SURGICAL HISTORY:  Insulin-dependent diabetes mellitus,  hypertension, hyperlipidemia, osteoarthritis, morbid obesity, CHF.   MEDICATIONS PRIOR TO ADMISSION:  1. Byetta 10 units b.i.d.  2. Humulin 70/30, 75 units in the morning, 40 units q.h.s.  3. Diltiazem 120 mg daily.  4. KCl 20 mEq t.i.d.  5. Januvia 100 mg daily.  6. Benazepril/HCTZ 20/12.5 mg 2 tablets daily.  7. Glimepiride 4 mg b.i.d.  8. Furosemide 40 mg b.i.d.  9. Aspirin 81 mg daily.  10.Simvastatin 40 mg q.h.s.   ALLERGIES:  No known drug allergies.   FAMILY HISTORY:  No known family history of colorectal carcinoma, liver  or chronic GI problems.   SOCIAL HISTORY:  [name] Olsen lives at home with his son.  He does have a  nurse who visits during the day.  He denies any tobacco, alcohol or drug  use.   REVIEW OF SYSTEMS:  See HPI, otherwise negative.   PHYSICAL EXAMINATION:  VITAL SIGNS:  Weight 149.3 kg, height 73 inches,  temperature 98.4, pulse 79, respirations 20, blood pressure 126/46.  O2  sat 96% on room air.  GENERAL:  [name] Olsen is a morbidly obese African American male who is  alert, oriented, pleasant and cooperative.  HEENT:  Sclerae are clear.  Nonicteric.  Conjunctivae pink.  Oropharynx pink and moist without any lesions.  NECK:  Supple without mass or thyromegaly.  CHEST:  Heart regular rate and rhythm.  Normal S1-S2.  LUNGS:  With decreased breath sounds bilaterally.  No adventitious  sounds.  ABDOMEN:  Protuberant with positive bowel sounds x4.  No bruits  auscultated.  He does have a moderate-sized umbilical hernia which is  easily reducible.  The abdomen is tight, distended, without positive  fluid wave.  No discrete mass or hepatosplenomegaly noted although the  exam is limited given the patient's body habitus.  He has a rectal tube  in  place with moderate brown stool in the rectal tube.  EXTREMITIES:  He has profound edema to bilateral extremities.  SKIN:  Brown, warm and dry without any rash or jaundice.   LABORATORY STUDIES:  WBC 11.8, platelets 301, sodium 145, potassium 3.9,  chloride 116, CO2 18, glucose 97, BUN 76, creatinine 2.6, calcium 8.6,  total CK was 296, CK-MB 7, troponin negative x 3.  Trace blood in his  urinalysis and small leukocytes and few bacteria.  Fecal occult blood  positive and blood cultures negative x 24 hours.   IMPRESSION:  [name] Olsen is a 77 year old male with metabolic acidosis,  renal insufficiency found upon admission.  It is questionable as to  whether this is acute or chronic with normocytic anemia and Hemoccult-  positive stool.  There is no evidence of active or gross GI bleeding at  this time.  He has very little GI symptoms, including rare indigestion  and seemingly insignificant low abdominal cramp-like pain that is  very  intermittent.  Hemoglobin has dropped from 11.5 to 10.2, some of which  could be dilutional.  Nonetheless, I have discussed colonoscopy with Mr.  Olsen, which he will need at some point in time, although it seems as  though this may be able to be done on an outpatient basis.   PLAN:  1. Will continue to monitor H&H.  2. Would get iron studies.  3. Would keep on PPI along with aspirin and Lovenox at this point.  4. A significant drop in hemoglobin.  Would perform EGD.  Otherwise we      will plan on outpatient colonoscopy.   Thank you, [facility], for allowing [place] to participate in the care  of [name] Olsen.      Lorenza Burton, N.P.      Kassie Mends, M.D.  Electronically Signed    KJ/MEDQ  D:  06/09/2007  T:  06/09/2007  Job:  [id number]

## 2010-10-13 NOTE — Group Therapy Note (Signed)
NAME:  [name] Olsen, [name] Olsen NO.:  [id number]   MEDICAL RECORD NO.:  [id number]          PATIENT TYPE:  INP   LOCATION:  A326                          FACILITY:  [facility]   PHYSICIAN:  Osvaldo Shipper, MD     DATE OF BIRTH:  [birth year]   DATE OF PROCEDURE:  07/04/2007  DATE OF DISCHARGE:                                 PROGRESS NOTE   SUBJECTIVE:  The patient is feeling better.  His pain is better  controlled.  He said he would like to go home, if possible, when he is  ready for discharge.   His vital signs are all stable, he is afebrile, his blood pressure is  very well controlled, saturating 94% on room air.  His CBG is 110, 108, so finally they are improved as well.  His lungs are clear to auscultation bilaterally.  His cardiac exam is unremarkable.  His extremities are covered in dressing.  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   LABORATORY DATA:  His white count is 15,000, hemoglobin 9.3, platelet  count is 346.  He has a potassium of 3.2 today.  His BUN is 38,  creatinine has improved to 1.4.   ASSESSMENT/PLAN:  1. Lower extremity cellulitis bilaterally with chronic wounds.  He has      been seen by Dr. Malvin Johns who recommended local care.  Dr. Malvin Johns      thinks that if his wounds do not improve, in the long term he may      need amputation at some point.  He does not feel any need for acute      intervention at this point.  2. Continues to be on vancomycin and Zosyn.  Finally his white cell      count is reducing, so I think he needs to continue this for a few      more days.  3. Acute renal failure.  He came in with a BUN and creatinine which      was significantly elevated at 138 and 4.4.  He had a lot of lower      extremity edema.  He was seen by Dr. Kristian Covey and was put on      intravenous Lasix with which he has improved.  His BUN has come      down to 38 and he is 1.4.  He will continue on p.o. Demadex.  4. Anemia.  He  has a history of anemia with Heme-positive stools.  He      was transfused a couple of units, his hemoglobin is stable now at      9.3.  He was seen by GI who did an esophagogastroduodenoscopy and a      colonoscopy on him.  Their report is available in the chart.  He      was put on proton pump inhibitor b.i.d.  We are awaiting path      results from biopsies that were taken.  5. Insulin-dependent diabetes.  He required adjustment of his Lantus      for better control.  He  is prone to hypoglycemia with 70/30      insulin, so that was discontinued.  6. He is severely malnourished for which he is getting nutritional      supplements.  He has also had sacral decub stage 2 for which he is      getting wound care.  For his poor p.o. intake he is getting megace      to improve his appetite.   PLAN:  The plan on him is for him to get an intravenous antibiotic for a  few more days and then he needs to be placed.  Clearly, the patient  cannot go home.  Lovette Cliche, our social worker, is working on  placement as we speak.      Osvaldo Shipper, MD  Electronically Signed     GK/MEDQ  D:  07/04/2007  T:  07/04/2007  Job:  [id number]

## 2010-10-13 NOTE — Discharge Summary (Signed)
NAME:  [name] Olsen, [name] Olsen NO.:  [id number]   MEDICAL RECORD NO.:  [id number]          PATIENT TYPE:  INP   LOCATION:  A203                          FACILITY:  [facility]   PHYSICIAN:  Skeet Latch, DO    DATE OF BIRTH:  [birth year]   DATE OF ADMISSION:  06/07/2007  DATE OF DISCHARGE:  01/12/2009LH                               DISCHARGE SUMMARY   DISCHARGE DIAGNOSES:  1. Acute on chronic renal failure.  2. Hyperkalemia not resolved.  3. Metabolic acidosis.  4. Normocytic anemia.  5. Obesity.  6. Insulin-dependent diabetes.  7. Leukocytosis.  8. Pedal edema.  9. Elevated troponins.   CONSULTATIONS:  1. [facility].  2. Gastroenterology.  3. Nephrology.   HOSPITAL COURSE:  Mr. Rideaux is a 80-year African American male who  presented after episodes of weakness and was status post fall.  The  patient was in the restroom and began to feel weak.  He fell down  secondary to weakness.  The patient denied any lightheadedness,  dizziness, headache lost consciousness or pain at that time.  The  patient called EMS, came to the emergency room, was found to be in renal  failure, hyperkalemic, having metabolic acidosis and was admitted to the  hospital.  The patient was given multiple doses of Kayexalate for his  hyperkalemia which ultimately resolved.  For his renal failure,  Nephrology was consulted.  The patient was possibly IV hydrated and  placed on sodium bicarb orally for his renal free and his metabolic  acidosis.  The patient was also found to have leukocytosis and blood  cultures and urine cultures so far were negative.  The patient was  placed on IV Levaquin which was subsequently switched to p.o.  The  patient was also found to be anemic and was Hemoccult positive.  Gastroenterology was consulted.  No invasive procedures were recommended  this time, and the patient was recommended to have an outpatient  colonoscopy/EGD.  The patient had iron studies  performed which showed  the patient was iron deficient, and he was placed on ferrous sulfate  twice a day.  The patient was also found to have elevated troponins.  Cardiology was consulted and did not recommend any invasive procedures  at this time.  It was recommended the patient have conservative  management.  The patient has slowly improved during hospital stay and  had no really obvious complaints, and at this time, we felt patient was  stable enough to be discharged.   RADIOLOGIC STUDIES:  Initial chest x-ray showed cardiac enlargement  without heart failure.  Repeat chest x-ray showed cardiomegaly with mild  pulmonary vascular congestion left basilar atelectasis.  The patient did  have a ultrasound of kidneys performed which showed:  1. Bilateral hydronephrosis.  Slightly smaller right kidney noted.  2. Nonvisualization of the bladder likely to be decompressed.  3. Echogenic shadowing of the gallbladder.   Vital signs on discharge, temperature 97.7, pulse 72, respirations 20,  blood pressure 162/77.  The patient sating 98% on room air.   LABORATORY DATA:  White count 11.2, hemoglobin 9.5, hematocrit 28.7,  platelet count 242, so far his blood cultures are negative total  creatinine kinase 207, CK-MB 3.81, troponin 0.07, sodium 142, potassium  3.8, chloride 112, CO2 21, glucose 147, BUN 50, creatinine 1.86.  Urine  culture was negative.   MEDICATIONS ON DISCHARGE:  1. Byetta 10 units breakfast and dinner.  2. Humulin 70/30 seventy-five units every morning.  Humulin 70/30      forty units at bedtime.  3. Diltiazem 120 mg daily.  4. Januvia 50 mg daily.  5. Benazepril/HCTZ 20/12.5 mg 2 tablets daily.  6. Glimepiride 4 mg 2 tablets daily.  7. Furosemide 20 mg 2 tablets daily.  8. Aspirin 81 mg daily.  9. Simvastatin 40 mg at bedtime.  10.Ferrous sulfate 325 mg p.o. b.i.d.   INSTRUCTIONS:  The patient is to maintain a low-salt, heart-healthy  diet.  The patient is to return  to his normal activities.  I believe the  patient is wheelchair-bound.  The patient is to follow-up with Dr.  Lodema Hong in 7-10 days.  The patient is to have outpatient follow-up with  Gastroenterology in one month for possible colonoscopy and EGD.  The  patient is instructed to return to emergency room if he has any major  complaints or call his primary care physician.      Skeet Latch, DO  Electronically Signed     SM/MEDQ  D:  06/12/2007  T:  06/12/2007  Job:  [id number]   cc:   Milus Mallick. Lodema Hong, M.D.  Fax: [phone]   Kassie Mends, M.D.  [address]  [place] , [place] [address]

## 2010-10-13 NOTE — Group Therapy Note (Signed)
NAME:  [name] Olsen, [name] Olsen NO.:  [id number]   MEDICAL RECORD NO.:  [id number]          PATIENT TYPE:  INP   LOCATION:  A326                          FACILITY:  [facility]   PHYSICIAN:  Dorris Singh, DO    DATE OF BIRTH:  08/07/1926   DATE OF PROCEDURE:  DATE OF DISCHARGE:                                 PROGRESS NOTE   The patient is sitting in bed, does not have any complaints for today.  Currently we are following him for cellulitis.  The patient is currently  on Vancomycin.  States that he does not have any concerns.  Leg feels  about the same.   His vitals for today are stable.  His blood pressure is 157/67.  GENERALLY:  This is a 77 year old African-American male who is obese, in  no acute distress.  HEART:  Regular rate and rhythm, no murmur noted.  LUNGS:  Clear to auscultation.  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   LABORATORY DATA:  His white count is 12.9, his hemoglobin is 9.2,  hematocrit 27.3, platelet count is 334.  His chemistry:  His  abnormalities are his blood sugar is at 279, BUN 30, creatinine 1.52.   ASSESSMENT AND PLAN:  1. Lower extremity cellulitis bilaterally with chronic wounds.  He is      being seen by Dr. Malvin Johns and also wound care, and currently the      patient is on Vancomycin and Zosyn so it should help his      leukocytosis.  2. Leukocytosis, which seems to be improving, currently on Vancomycin      and Zosyn.  We will continue to monitor that.  3. His elevated BUN and creatinine.  He is being followed by Dr.      Kristian Covey and I would appreciate his recommendations.  4. Anemia.  The patient's blood has remained around 9.2-9.3.  We will      continue to monitor this.  Also he did have an EGD and he was      started on a PPI so we will continue to monitor  this as well.  5. His insulin-dependent diabetes mellitus.  He is currently on      Lantus.  We will have diabetic teaching come see him for any other      recommendations, and currently he is still getting nutritional      supplements which wound care is taking care of at this point in      time as well.   PLAN:  Social services is working on placement, however the patient is  not interested in it at this time, but due to the fact that he lives  alone, does not have anyone to take care of him, and he cannot take care  of the cellulitis and the wound and the decubitus ulcers, that some type  of skilled or nursing home placement would be in his  best interest at  this point in time.      Dorris Singh, DO  Electronically Signed     CB/MEDQ  D:  07/05/2007  T:  07/05/2007  Job:  [id number]

## 2010-10-13 NOTE — Group Therapy Note (Signed)
NAME:  [name] Olsen, SOLANKI NO.:  [id number]   MEDICAL RECORD NO.:  [id number]          PATIENT TYPE:  INP   LOCATION:  A203                          FACILITY:  [facility]   PHYSICIAN:  Skeet Latch, DO    DATE OF BIRTH:  05-29-1927   DATE OF PROCEDURE:  DATE OF DISCHARGE:                                 PROGRESS NOTE   SUBJECTIVE:  Mr. Arscott continues to be improving.  The patient is not  having any complaints of shortness of breath, chest pain, abdominal pain  today.   OBJECTIVE:  VITAL SIGNS:  Temperature 97.9, pulse 75, respirations 20,  blood pressure 171/65, the patient satting 97%.  CARDIOVASCULAR:  Regular rate and rhythm.  No murmurs, rubs or gallops.  LUNGS:  Clear to auscultation bilaterally.  No rales, rhonchi or  wheezing.  ABDOMEN:  Obese, soft, nontender, nondistended, positive bowel sounds.  No rigidity or guarding.  EXTREMITIES:  Continues to have some chronic changes with lichen planus  on his lower extremities.  Continues to have pedal edema, it seems to be  chronic.   LABORATORY:  Total creatinine kinase 240, CK-MBs 4.4, troponin 0.07,  sodium 149, potassium 3.9, chloride is 114, CO2 20, glucose 136, BUN 66,  creatinine 2.26.  So far, his blood cultures are negative.   ASSESSMENT/PLAN:  1. Hyperkalemia.  This is resolved.  The patient may need to have his      potassium restarted, if it continues to drop.  2. Renal insufficiency.  This seems to be improving.  I will continue      IV hydration and will await nephrology, if the patient can be      discharged.  3. Diabetes.  Continue present care.  4. Heme-occult-positive stool.  Gastroenterology has seen the patient.      I recommend outpatient followup, possibly colonoscopy and an      esophagogastroduodenoscopy.  The patient is also seen by      cardiology, and his cardiovascular disease seems to be stable also      at this time.  There will be no current workup for any cardiac      issues at  present.   Pending nephrology recommendations, the patient will probably be  discharged in the next one or two days if stable.      Skeet Latch, DO  Electronically Signed     SM/MEDQ  D:  06/10/2007  T:  06/10/2007  Job:  [id number]

## 2010-10-13 NOTE — Consult Note (Signed)
NAME:  [name] Olsen, [name] Olsen NO.:  [id number]   MEDICAL RECORD NO.:  [id number]          PATIENT TYPE:  INP   LOCATION:  A326                          FACILITY:  [facility]   PHYSICIAN:  Jorja Loa, M.D.DATE OF BIRTH:  05-30-1927   DATE OF CONSULTATION:  DATE OF DISCHARGE:                                 CONSULTATION   REQUESTING PHYSICIAN:  Dr. Ladona Ridgel.   REASON FOR CONSULTATION:  [name] Olsen is a 77 years old African American  male with medical problems including history of diabetes, hypertension,  history of obesity, being treated, anasarca, who presently came because  of hypoglycemia and also leg swelling.  [name] Olsen was admitted at the  beginning of January with similar problem; during that time, his BUN and  creatinine improved and he diuresed very well and sent home to come back  with only a similar problem in 2 weeks.  At this moment, he denies any  nausea or vomiting.  He denies any shortness of breath.   PAST MEDICAL HISTORY:  As stated above, patient with:  1. History of chronic renal failure, probably stage III.  2. History of hypertension.  3. History of hypoglycemia.  4. History of anemia.  5. History of dyslipidemia.  6. History of CHF.  7. History of obesity.  8. History of cellulitis of his leg and significant anasarca      __________  treated.   MEDICATIONS:  1. Colace 100 mg p.o. daily.  2. Demodex 80 mg subcu every 24 hours.  3. Lasix 40 mg IV daily.  4. He is on Novolin insulin.  5. He is also getting Dilaudid.  6. Other medications are p.r.n. medications.   ALLERGIES:  He is allergic to ARICEPT.   SOCIAL HISTORY:  No history of smoking.  No history of alcohol abuse.   REVIEW OF SYSTEMS:  His main complaint seems to be weakness.  He denies  any shortness of breath.  No dizziness or lightheadedness.  He has also  occasional shortness of breath.  He does not have any nausea or  vomiting.  He states that he feels thirsty and he will  drink lots of  water.   EXAMINATION:  GENERAL:  The patient is alert, in no apparent distress.  His  VITAL SIGNS:  His blood pressure is 108/40, temperature is 99.4, pulse  of 100.  CHEST:  Decreased breath sounds, otherwise seems to be clear, no rales  or rhonchi, no egophony.  HEART:  Exam revealed a regular rate and rhythm.  No murmur noted.  S1  and S2.  ABDOMEN:  Obese, difficult to palpate organomegaly.  EXTREMITIES:  He has bilateral edema and also he has some abrasion or  cellulitis of both legs.  GU:  He had about 2.9 L since he came as a urine.   LABORATORY DATA:  His white blood cell count is 12, hemoglobin 10.2,  hematocrit 30.2 and platelets of 431,000.  Sodium is 130, potassium 5.7.  BUN is 138, creatinine 4.43; when he came, his BUN was 143, creatinine  5.4 and when he left from her, his creatinine  was as low as 1.56.   ASSESSMENT:  1. Renal insufficiency, acute on chronic.  At this moment, the      etiology is not clear.  He came back within a week.  He had some      mild urethral stricture.  He does not have any obstruction.  He has      a Foley catheter.  At this moment, BUN and creatinine seem to be      improving.  His creatinine is also coming down, probably to acute      tubular necrosis.  2. Hyperkalemia, probably related to his renal failure.  His potassium      has increased, even though he is making some urine.  3. History of obesity.  The patient __________ .  4. History of anasarca.  Patient with significant fluid overload,      cardiac versus also secondary to the proteinuria.  5. History of dyslipidemia.  6. History of hypertension; blood pressure seems to be controlled very      well.  7. History of anemia.  Hemoglobin and hematocrit stable.  8. History of possible cellulitis.   RECOMMENDATIONS:  I agree with present management.  We will give him  additional Kayexalate 30 g, continue with the Lasix.  I will follow the  patient.  Since he had a  workup during his last admission including  ultrasound which showed right kidney to be 11.8, left kidney 13.2, and  at this moment, no further workup.  However, since he has questionable  bilateral hydronephrosis, mild to moderate last time, I am not sure  whether he has obstruction.  Hence, we may repeat that and if he has,  probably we may need to be seen by Urology, especially if there is no  improvement.      Jorja Loa, M.D.  Electronically Signed     BB/MEDQ  D:  06/28/2007  T:  06/28/2007  Job:  [id number]

## 2010-10-13 NOTE — Consult Note (Signed)
NAME:  [name] Olsen, [name] Olsen NO.:  [id number]   MEDICAL RECORD NO.:  [id number]          PATIENT TYPE:  INP   LOCATION:  A326                          FACILITY:  [facility]   PHYSICIAN:  Barbaraann Barthel, M.D. DATE OF BIRTH:  01/25/1927   DATE OF CONSULTATION:  06/29/2007  DATE OF DISCHARGE:                                 CONSULTATION   HISTORY OF PRESENT ILLNESS:  Surgery was asked to see this 77 year old  black male, type 1 diabetic for bilateral lower extremity skin lesions.   In essence, he has an area of excoriation of the skin in the lateral and  pretibial area of the left leg and more lateral in the mid right lower  extremity.  He also has a tremendous lymphedema type changes with  thickening of the skin and elephantiasis type of thickened skin from the  secondary lymphedema.  He is essentially wheelchair bound.  He is not  ambulatory according to his daughter.   I cannot evaluate his pulses due to his body habitus and just  clinically.  I really have nothing to offer this patient surgically  hopefully we can keep his wounds clean and prevent further breakdown of  his bed sores, but in the long run, he is likely to require amputations  bilaterally as I really have nothing further to offer him.  The present  topical therapy is reasonable and he is to continue this.  I will follow  as needed.      Barbaraann Barthel, M.D.  Electronically Signed     WB/MEDQ  D:  06/29/2007  T:  06/30/2007  Job:  [id number]   cc:   [facility]

## 2010-10-13 NOTE — H&P (Signed)
NAME:  [name] Olsen, [name] Olsen NO.:  [id number]   MEDICAL RECORD NO.:  [id number]          PATIENT TYPE:  INP   LOCATION:  A203                          FACILITY:  [facility]   PHYSICIAN:  Skeet Latch, DO    DATE OF BIRTH:  July 22, 1926   DATE OF ADMISSION:  06/07/2007  DATE OF DISCHARGE:  LH                              HISTORY & PHYSICAL   PRIMARY CARE PHYSICIAN:  Dr. Lodema Hong.   CHIEF COMPLAINT:  Weakness.   HISTORY OF PRESENT ILLNESS:  This is a 77 year old African-American male  who presents after an episode of a fall.  The patient states that this  afternoon he was in the restroom and began to feel weak and fell and  could not get up secondary to weakness.  The patient denies any  lightheadedness, dizziness, headache, loss of consciousness, or pain of  any kind.  The patient states that he usually carries his phone with him  and called EMS.  EMS arrived and brought the patient  to the hospital.  Upon being seen in the emergency room the patient was found to have  renal insufficiency, hyperkalemia, metabolic acidosis, and was admitted  to the hospital.  At this time the patient's only complaint is some mild  back and leg pain which seemed to be chronic in nature. But over all the  patient seems to be doing well.  The patient's past medical history  includes insulin-dependent diabetes, hypertension, hyperlipidemia,  osteoarthritis.   SURGICAL HISTORY:  None.   SOCIAL HISTORY:  Denies any alcohol, tobacco, or illicit drug use.   ALLERGIES:  No known drug allergies.   REVIEW OF SYSTEMS:  HEENT: Unremarkable. RESPIRATORY:  Denied any cough,  dyspnea, or wheezing.  GASTROINTESTINAL: Denies any nausea, vomiting, or  diarrhea.  GENITOURINARY: Denies any urgency, dysuria, frequency.  MUSCULOSKELETAL: Positive for some back pain and leg pain. SKIN:  Positive for pedal edema. NEUROLOGIC:  No headaches or altered mental  status, lightheadedness.  CARDIOVASCULAR: No chest pain  or palpitations.  Other systems are negative.   CURRENT HOME MEDICATIONS:  Include Byetta  10 units at breakfast and  dinner.  Humulin 70/30 75 units in the morning and 40 units at bedtime.  Diltiazem 120 mg daily, potassium 20 mEq three times a day. Januvia 100  mg daily, benazepril/HCTZ 20/12.5 mg 2 tablets daily. Loperamide 4 mg  twice a day. Furosemide 20 mg 2 tablets daily, aspirin 81 mg daily,  simvastatin 40 mg at bedtime.   PHYSICAL EXAM:  GENERAL:  Patient is a 77 year old African American male  who does  not appear to be in acute distress.  The patient is lying on  his right side. Does not look uncomfortable at this time. The patient is  alert and oriented x3, pleasant and cooperative.  HEENT: Head is atraumatic, normocephalic.  Eyes PERRL.  EOMI.  NECK:  Supple, nontender, nondistended.  No JVD.  No thyromegaly  appreciated.  CARDIOVASCULAR:  Distant heart sounds,  S1-S2 is regular.  No murmurs  appreciated.  LUNGS:  Decreased but clear to auscultation bilaterally.  No rales,  rhonchi or  wheezing.  ABDOMEN: Obese, soft, nontender, nondistended.  Positive bowel sounds.  No rigidity or guarding. No rebound tenderness is noted.  EXTREMITIES: Bilateral chronic changes are noted.  The patient has very  edematous feet bilaterally with chronic changes noted. Has malformed  toes as well as onychomycosis bilaterally.  NEUROLOGICALLY:  Cranial nerves II-XII are grossly intact. Patient is  alert and oriented x3.   LABS:  Sodium 137, potassium 6.8, chloride is 112, CO2 of 16, BUN 97,  creatinine 2.98, glucose 94. Hemoglobin 11.5, hematocrit 34.7, white  count 14.9, platelets 335. Calcium 9.8. Myoglobin greater than 500. His  CK-MB 9.5. Troponin less than 0.05.   His EKG shows:  1. Right bundle branch block.  2. 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   ASSESSMENT AND PLAN:  1. Hyperkalemia. The  patient was given dextrose, sodium bicarb,      insulin, and calcium chloride, and Kayexalate 45 grams in the      emergency room.  I will give another 60 grams p.o. x2 of Kayexalate      to reduce his potassium level at this time.  And will recheck in      the next 4-6 hours.  Will also get a repeat EKG to rule out any      changes in the EKG.  2. For his acute renal failure will IV hydrate the patient with normal      saline and get a nephrology consult in the a.m. to get nephrology      recommendations.  3. For his diabetes the patient will be placed on sliding scale with a      blood sugars checked at a.c. and h.s., and the patient will be      placed on his home insulin as well as his oral hyperglycemics.  4. Leukocytosis.  Will get blood cultures as well as urine cultures.      Will empirically place the patient on Levaquin and await cultures      and sensitivities.  5. For his anemia, not sure if this is chronic in nature. Will get      Hemoccult of stools and get iron studies.  6. Pedal edema.  This is probably chronic in nature.  The patient      probably needs an echocardiogram to get his ejection fraction.      Pending echo results and the patient's diuresis may need a      cardiology consult.  The patient will be followed carefully for any      further recommendations.      Skeet Latch, DO  Electronically Signed     SM/MEDQ  D:  06/08/2007  T:  06/08/2007  Job:  [id number]

## 2010-10-13 NOTE — Group Therapy Note (Signed)
NAME:  [name] Olsen, [name] Olsen NO.:  [id number]   MEDICAL RECORD NO.:  [id number]          PATIENT TYPE:  INP   LOCATION:  A326                          FACILITY:  [facility]   PHYSICIAN:  Dorris Singh, DO    DATE OF BIRTH:  February 02, 1927   DATE OF PROCEDURE:  DATE OF DISCHARGE:                                 PROGRESS NOTE   The patient seen today resting in bed.  Has no complaints at this point  in time.  Currently, he is being treated for cellulitis and we are  looking for placement for him in a skilled nursing facility.  He refused  yesterday but seems more amenable to the recommendations today.   Temperature 97, pulse 83, respirations 16, blood pressure 147/67.  GENERAL:  A 77 year old male who is obese and in no acute distress.  HEART:  Regular rate and rhythm.  No murmur.  LUNGS:  Clear to auscultation bilaterally.  LOWER EXTREMITIES:  He has cellulitis of the skin for his bilateral  extremities and bandages covering cellulitis of the lower bilateral  legs.   LABORATORY DATA:  None.  Vancomycin trough is 12.7.   ASSESSMENT AND PLAN:  Bilateral lower extremity cellulitis with chronic  wounds.  He is being seen by Dr. Malvin Johns of the wound care.  Is  currently on vancomycin and Zosyn.  His leukocytosis, we will go ahead  and check his CBC tomorrow and BMET and follow that.  Dr. Kristian Covey will  be following him for renal insufficiency.  We will go ahead and check  the patient for anemia tomorrow as well.  He is currently on glycemic  protocol for insulin dependent diabetes.  Continue to follow this.   PLAN:  Social services is working on placement, and will continue to  work with him to get the best facility possible for him at this time.      Dorris Singh, DO  Electronically Signed     CB/MEDQ  D:  07/06/2007  T:  07/06/2007  Job:  [phone]

## 2010-10-13 NOTE — Consult Note (Signed)
NAME:  Olsen Olsen NO.:  [id number]   MEDICAL RECORD NO.:  [id number]          PATIENT TYPE:  INP   LOCATION:  A203                          FACILITY:  [facility]   PHYSICIAN:  Jorja Loa, M.D.DATE OF BIRTH:  [birth year]   DATE OF CONSULTATION:  DATE OF DISCHARGE:                                 CONSULTATION   REASON FOR CONSULTATION:  Hyperkalemia and also renal insufficiency.   PRIMARY CARE PHYSICIAN:  Dr. Lodema Hong.   Mr. [name] Olsen is a 77 year old African-American with a past medical  history of diabetes and history of hypertension, presently came with  history of weakness and also history of a fall.  Mr. [name] Olsen denies any  loss of consciousness or dizziness or light-headedness.  Overall, at  this moment he seems to be feeling better.  Mr. [name] Olsen also denies any  previous history of renal failure or history of kidney stone.  At this  moment there is no other documented history of proteinuria or renal  failure.  However when blood work was done the patient was found to have  elevated BUN and creatinine and also severe hyperkalemia.  This consult  was called.   PAST MEDICAL HISTORY:  He has history of diabetes for more than 10 years  and he has also a history of hypertension, history of dyslipidemia and  also history of CHF.  He has history of obesity, history of also  osteoarthritis.   MEDICATIONS:  1. Aspirin 81 mg p.o. daily.  2. He is also getting Tiazac 120 mg p.o. daily.  3. Colace 100 mg p.o. daily.  4. __________  30 mg q.h.s.  5. He is also getting Byetta 10 mg subcu b.i.d.  6. Lasix 40 mg IV once daily.  7. Amaryl 4 mg p.o. b.i.d.  8. He is also on Novolin insulin.  9. Levaquin 500 mg IV every 24 hours.  10.Protonix 40 mg p.o. daily.  11.Zocor 40 mg p.o. daily.  12.His IV fluid is normal saline at __________  .   All other medications are p.r.n. medications.   ALLERGIES:  No known allergies.   SOCIAL HISTORY:  No history of smoking or  alcohol abuse.   FAMILY HISTORY:  There is no family history of renal insufficiency.   REVIEW OF SYSTEMS:  At this moment the patient offers no complaints  except diarrhea which is new after he came to the hospital.  He denies  any nausea or vomiting, no shortness of breath, dizziness or light-  headedness.  However, he seems to be complaining of weakness for  sometime.  He states that he has also some swelling of his legs for  sometime and he has been on diuretics.   EXAMINATION:  At this moment the patient is alert, in no apparent  distress.  Temperature is 98.9, blood pressure is 110/87, pulse of 114.  HEENT EXAM:  No conjunctival pallor, no icterus, mucosa membranes are  moist.  CHEST:  Decreased breath sounds, otherwise seems to be clear.  No rales,  no rhonchi, no egophony.  He does not have any wheezing.  HEART:  Exam revealed  sounds S1, S2, no murmur.  ABDOMEN:  Obese, very difficult to palpate organomegaly, nontender,  positive bowel sounds.  EXTREMITIES:  He does not have any edema.   He had about 500 mL of urine.  His blood gas pH 7.325, pCO2 of 78, and  the bicarbonate is 14.5.  His white blood count is 12.4, hemoglobin  10.6, hematocrit 31.7.  Sodium 141, potassium 6.1, when he came  potassium was 7.2, chloride 118, SpO2 15, BUN is 86, creatinine was 2.93  yesterday.  Creatinine is 3.4 now.  His magnesium is 2.9, calcium 9.2.   ASSESSMENT:  1. Renal insufficiency:  At this moment I am not sure whether this is      acute or chronic since there is no previous history of renal      insufficiency and also no additional information which is      documented.  Probably he might have acute on chronic.  However,      simply as stated above could be just an acute renal failure.  The      etiology could be prerenal syndrome.  However, since the patient      was also on ACE inhibitor and diuretics, probably that also      contribute to his renal failure.  Presently, the patient  is      nonoliguric and his BUN and creatinine seem to be improving.  2. History of diabetes, long-standing:  He is on insulin.  He denies      any diabetic retinopathy and no neuropathy.  At this moment he does      not know whether he has any proteinuria and since we do not have      any urine at this moment, we are not for sure whether there is      diabetic nephropathy.  3. History of hypertension:  His blood pressure seems to be controlled      very well.  4. Hyperkalemia:  Probably related to renal insufficiency, however      since the patient has also metabolic acidosis, history of diabetes,      __________  at this moment __________  entertained.  Presently his      potassium seems to be coming down, possibly from Kayexalate which      the patient has been taking.  5. History of dyslipidemia.  6. History of obesity.  7. History of congestive heart failure:  He is on diuretics.   RECOMMENDATIONS:  Will do UA and also will do 24-hour urine for protein  and immunoelectrophoresis, especially if the UA shows some protein in  the urine.  Will order ultrasound of the kidney to evaluate kidney size  and to rule out obstructive uropathy. I agree with hydration.  At this  moment will increase his IV fluids to hydrate the patient very well and  will continue Lasix and will follow the patient.  Since the patient has  also metabolic  acidosis, at this moment will basically treat him probably with sodium  bicarb orally.  Will continue to monitor his input and output and will  follow patient.  Also, check __________  is low, will go ahead and  __________  whether this is anemia of iron deficiency or anemia of  chronic disease.      Jorja Loa, M.D.  Electronically Signed     BB/MEDQ  D:  06/08/2007  T:  06/08/2007  Job:  [id number]

## 2010-10-13 NOTE — Group Therapy Note (Signed)
NAME:  [name] Olsen, [name] Olsen NO.:  [id number]   MEDICAL RECORD NO.:  [id number]          PATIENT TYPE:  INP   LOCATION:  A203                          FACILITY:  [facility]   PHYSICIAN:  Skeet Latch, DO    DATE OF BIRTH:  09-Jul-1926   DATE OF PROCEDURE:  06/08/2007  DATE OF DISCHARGE:                                 PROGRESS NOTE   SUBJECTIVE:  [name] Olsen seems to be doing well this morning.  The  patient has no major complaints on my exam.  The patient still has some  back pain and leg pain which seems to be chronic in nature but denies  any chest pain, abdominal pain, urinary discomfort or any new  complaints.  The patient does not seem to be in any acute distress at  this time.   OBJECTIVE:  VITAL SIGNS:  Temperature is 98.9, pulse 111, respirations  20, blood pressure is 110/67, O2 saturation 96% on room air.  CARDIOVASCULAR:  Regular rate and rhythm.  No rubs, gallops, murmurs.  LUNGS:  Clear to auscultation bilaterally.  No rales, rhonchi or  wheezes.  ABDOMEN:  Obese, soft, nontender, nondistended.  Positive bowel sounds.  No rigidity or guarding.  EXTREMITIES:  The patient has chronic thickened and darkened skin  bilateral lower extremities with edema.  No pain on palpation.  She does  have pedal pulses.   LABORATORY DATA:  ABG showed pH is 7.326, pCO2 28.5, pO2 76.5, bicarb  14.5, magnesium 2.9, sodium 141, potassium 6.1, chloride is 118, CO2 15,  glucose 65, BUN 95, creatinine 2.93, white count 12.4, hemoglobin 10.6,  hematocrit 31.7, platelets 320.  Chest x-ray is pending at this time.   ASSESSMENT/PLAN:  1. Hyperkalemia. The patient did receive a 60 grams dose of Kayexalate      once he reached the floor.  His potassium seems to be decreasing.      The patient will get another 60 gram dose and repeat his potassium      4 hours after that dose.  Will adjust more Kayexalate depending on      the patient's potassium at that time.  2. Acute renal failure.  Seems to be slightly improved, nephrology has      been consulted.  Will increase his IV rate at this time to see if      this improves his renal function.  Unsure if his renal function is      chronic in nature at this time. There is no history of renal      insufficiency. Will await nephrology recommendations and proceed at      this time.  3. Weakness.  I will get a PT consult to evaluate the patient's      mobility status.  4. Diabetes.  The patient has been placed on sliding scale with      nightly coverage. This was continued at this time and his insulin      will be adjusted as needed.      Skeet Latch, DO  Electronically Signed     SM/MEDQ  D:  06/08/2007  T:  06/08/2007  Job:  [id number]

## 2010-10-16 NOTE — Group Therapy Note (Signed)
NAME:  [name] Olsen, [name] Olsen NO.:  [id number]   MEDICAL RECORD NO.:  [id number]          PATIENT TYPE:  INP   LOCATION:  A203                          FACILITY:  [facility]   PHYSICIAN:  Skeet Latch, DO    DATE OF BIRTH:  November 19, 1926   DATE OF PROCEDURE:  06/08/2006  DATE OF DISCHARGE:                                 PROGRESS NOTE   SUBJECTIVE:  [name] Olsen seems to be doing well.  The patient has no  complaints. Denies any shortness of breath, chest pain, abdominal pain  at this time.   OBJECTIVE:  VITAL SIGNS:  Temperature 98, pulse 77, respirations 20,  blood pressure 118/56.  The patient is saturating 92% on room air.  CARDIOVASCULAR:  Regular rate and rhythm.  No murmurs, gallops, rubs.  LUNGS:  Clear to auscultation bilaterally.  No rhonchi, rales or  wheezes.  ABDOMEN:  Obese, soft, nontender, nondistended, positive bowel sounds.  No JVD or guarding.  EXTREMITIES:  Chronic stasis changes with darkened skin.  The patient  has pedal edema that seems to be chronic in nature.   LABORATORY DATA:  Blood cultures are negative.  Patient is fecal occult  positive.  Creatinine kinase 924, CK MB 6.9, troponin 0.09, hemoglobin  A1C 6.6, chloride 101, triglycerides 45, HDL 37, LDL 55.  ABG:  pH  7.324, CO2 29.7, PO2 75.6, bicarbonate 15, magnesium 2.9.  Sodium 141,  potassium 6.1, chloride 118, CO2 15, glucose 55, BUN 95, creatinine  2.93.  white count 12.4, hemoglobin 12.6, hematocrit 31.7, platelets  320.   ASSESSMENT/PLAN:  1. Hyperkalemia.  The patient was given multiple doses of Kayexalate      last evening.  Last documented potassium was 6.1.  The patient is      scheduled to get a repeat potassium in the next few hours depending      on the result.  The patient may need additional dose of Kayexalate.  2. Renal insufficiency.  The patient is being seen by nephrology at      this time.  Not sure if the patient has acute versus chronic renal      failure.  BUN and  creatinine seems to be improving.  The patient      will continue to be IV hydrated.  3. Diabetes.  The patient is on insulin as well as sliding scale.      NovoLog will be discontinued due to hypoglycemic episodes.  I will      continue to follow and adjust his medications as needed.  4. Leukocytosis.  Seems to be improving.  The patient is on      antibiotics, and his blood cultures so far are negative.  5. Hemoccult positive.  Will get a GI consult.  Unsure when the      patient's last colonoscopy was performed.  Await      gastroenterology and recommendations at this time.  6. Patient has history of congestive heart failure.  He is currently      on diuretics.  Will continue to follow.  Will add a BNP to his a.m.  labs.      Skeet Latch, DO  Electronically Signed     SM/MEDQ  D:  06/09/2007  T:  06/09/2007  Job:  [phone] [id number]

## 2011-02-18 LAB — URINALYSIS, ROUTINE W REFLEX MICROSCOPIC
Bilirubin Urine: NEGATIVE
Glucose, UA: NEGATIVE
Glucose, UA: NEGATIVE
Ketones, ur: NEGATIVE
Ketones, ur: NEGATIVE
Nitrite: NEGATIVE
Protein, ur: NEGATIVE
Protein, ur: NEGATIVE
pH: 5

## 2011-02-18 LAB — CBC
HCT: 28.2 — ABNORMAL LOW
HCT: 28.7 — ABNORMAL LOW
HCT: 31.5 — ABNORMAL LOW
HCT: 31.7 — ABNORMAL LOW
HCT: 34.7 — ABNORMAL LOW
HCT: 25.8 — ABNORMAL LOW
HCT: 25.9 — ABNORMAL LOW
HCT: 30.2 — ABNORMAL LOW
Hemoglobin: 10.6 — ABNORMAL LOW
Hemoglobin: 11.5 — ABNORMAL LOW
Hemoglobin: 9.5 — ABNORMAL LOW
Hemoglobin: 10.2 — ABNORMAL LOW
Hemoglobin: 8.9 — ABNORMAL LOW
MCHC: 33
MCHC: 33.1
MCHC: 33.3
MCHC: 34.1
MCV: 82.5
MCV: 82.6
MCV: 83.2
MCV: 83.5
MCV: 84
MCV: 81.9
MCV: 82.2
MCV: 83.2
Platelets: 294
Platelets: 301
Platelets: 320
Platelets: 335
Platelets: 346
Platelets: 381
RBC: 3.38 — ABNORMAL LOW
RBC: 3.75 — ABNORMAL LOW
RBC: 3.84 — ABNORMAL LOW
RBC: 4.21 — ABNORMAL LOW
RBC: 3.25 — ABNORMAL LOW
RBC: 3.63 — ABNORMAL LOW
RDW: 17.5 — ABNORMAL HIGH
RDW: 18 — ABNORMAL HIGH
RDW: 18.1 — ABNORMAL HIGH
RDW: 17.2 — ABNORMAL HIGH
RDW: 17.6 — ABNORMAL HIGH
RDW: 17.6 — ABNORMAL HIGH
WBC: 10.2
WBC: 10.7 — ABNORMAL HIGH
WBC: 11.8 — ABNORMAL HIGH
WBC: 12.4 — ABNORMAL HIGH
WBC: 14.9 — ABNORMAL HIGH
WBC: 12 — ABNORMAL HIGH
WBC: 13.5 — ABNORMAL HIGH
WBC: 17.1 — ABNORMAL HIGH
WBC: 18.1 — ABNORMAL HIGH

## 2011-02-18 LAB — DIFFERENTIAL
Basophils Absolute: 0
Basophils Absolute: 0
Basophils Absolute: 0
Basophils Absolute: 0
Basophils Absolute: 0
Basophils Absolute: 0
Basophils Relative: 0
Basophils Relative: 0
Basophils Relative: 0
Basophils Relative: 0
Eosinophils Absolute: 0.1
Eosinophils Absolute: 0.1
Eosinophils Absolute: 0.3
Eosinophils Absolute: 0.3
Eosinophils Absolute: 0
Eosinophils Absolute: 0
Eosinophils Absolute: 0.3
Eosinophils Relative: 1
Eosinophils Relative: 1
Eosinophils Relative: 2
Eosinophils Relative: 3
Eosinophils Relative: 3
Eosinophils Relative: 3
Eosinophils Relative: 0
Eosinophils Relative: 1
Eosinophils Relative: 2
Lymphocytes Relative: 11 — ABNORMAL LOW
Lymphocytes Relative: 5 — ABNORMAL LOW
Lymphocytes Relative: 8 — ABNORMAL LOW
Lymphocytes Relative: 8 — ABNORMAL LOW
Lymphocytes Relative: 4 — ABNORMAL LOW
Lymphocytes Relative: 4 — ABNORMAL LOW
Lymphocytes Relative: 8 — ABNORMAL LOW
Lymphs Abs: 0.8
Lymphs Abs: 1
Lymphs Abs: 1
Lymphs Abs: 1.2
Lymphs Abs: 1.3
Lymphs Abs: 0.5 — ABNORMAL LOW
Lymphs Abs: 0.6 — ABNORMAL LOW
Lymphs Abs: 0.9
Lymphs Abs: 1.4
Monocytes Absolute: 0.8
Monocytes Absolute: 1.1 — ABNORMAL HIGH
Monocytes Absolute: 1.3 — ABNORMAL HIGH
Monocytes Absolute: 0.9
Monocytes Absolute: 1.6 — ABNORMAL HIGH
Monocytes Absolute: 2.2 — ABNORMAL HIGH
Monocytes Relative: 10
Monocytes Relative: 10
Monocytes Relative: 6
Monocytes Relative: 8
Monocytes Relative: 11
Monocytes Relative: 12
Monocytes Relative: 13 — ABNORMAL HIGH
Monocytes Relative: 8
Neutro Abs: 10.1 — ABNORMAL HIGH
Neutro Abs: 13.2 — ABNORMAL HIGH
Neutro Abs: 8.4 — ABNORMAL HIGH
Neutro Abs: 11.7 — ABNORMAL HIGH
Neutrophils Relative %: 74
Neutrophils Relative %: 75
Neutrophils Relative %: 81 — ABNORMAL HIGH
Neutrophils Relative %: 88 — ABNORMAL HIGH
Neutrophils Relative %: 81 — ABNORMAL HIGH
Neutrophils Relative %: 84 — ABNORMAL HIGH
Neutrophils Relative %: 86 — ABNORMAL HIGH

## 2011-02-18 LAB — WOUND CULTURE

## 2011-02-18 LAB — OCCULT BLOOD X 1 CARD TO LAB, STOOL: Fecal Occult Bld: POSITIVE

## 2011-02-18 LAB — BLOOD GAS, ARTERIAL
Bicarbonate: 14.5 — ABNORMAL LOW
Bicarbonate: 16.8 — ABNORMAL LOW
FIO2: 0.21
FIO2: 21
O2 Saturation: 94
Patient temperature: 37
TCO2: 14.3
TCO2: 16
pCO2 arterial: 28.5 — ABNORMAL LOW
pCO2 arterial: 29.7 — ABNORMAL LOW
pH, Arterial: 7.324 — ABNORMAL LOW
pH, Arterial: 7.331 — ABNORMAL LOW
pO2, Arterial: 76.5 — ABNORMAL LOW
pO2, Arterial: 77.6 — ABNORMAL LOW

## 2011-02-18 LAB — CARDIAC PANEL(CRET KIN+CKTOT+MB+TROPI)
CK, MB: 4.4 — ABNORMAL HIGH
CK, MB: 5.5 — ABNORMAL HIGH
CK, MB: 6.9 — ABNORMAL HIGH
CK, MB: 7 — ABNORMAL HIGH
Relative Index: 1.7
Relative Index: 1.8
Relative Index: 1.8
Relative Index: 1.9
Relative Index: 1.9
Relative Index: 2
Relative Index: 2.3
Total CK: 224
Total CK: 294 — ABNORMAL HIGH
Troponin I: 0.05
Troponin I: 0.06
Troponin I: 0.07 — ABNORMAL HIGH
Troponin I: 0.08 — ABNORMAL HIGH
Troponin I: 0.09 — ABNORMAL HIGH

## 2011-02-18 LAB — BASIC METABOLIC PANEL
BUN: 66 — ABNORMAL HIGH
BUN: 76 — ABNORMAL HIGH
BUN: 93 — ABNORMAL HIGH
BUN: 95 — ABNORMAL HIGH
BUN: 96 — ABNORMAL HIGH
BUN: 97 — ABNORMAL HIGH
BUN: 132 — ABNORMAL HIGH
BUN: 138 — ABNORMAL HIGH
BUN: 143 — ABNORMAL HIGH
BUN: 151 — ABNORMAL HIGH
CO2: 14 — ABNORMAL LOW
CO2: 14 — ABNORMAL LOW
CO2: 15 — ABNORMAL LOW
CO2: 16 — ABNORMAL LOW
CO2: 21
CO2: 20
CO2: 21
Calcium: 8.4
Calcium: 9.2
Calcium: 9.2
Calcium: 9.7
Calcium: 9.8
Calcium: 8.5
Calcium: 8.9
Chloride: 112
Chloride: 112
Chloride: 114 — ABNORMAL HIGH
Chloride: 116 — ABNORMAL HIGH
Chloride: 117 — ABNORMAL HIGH
Chloride: 118 — ABNORMAL HIGH
Chloride: 119 — ABNORMAL HIGH
Chloride: 104
Chloride: 106
Chloride: 106
Chloride: 106
Creatinine, Ser: 2.88 — ABNORMAL HIGH
Creatinine, Ser: 2.93 — ABNORMAL HIGH
Creatinine, Ser: 2.98 — ABNORMAL HIGH
Creatinine, Ser: 3.04 — ABNORMAL HIGH
Creatinine, Ser: 4.08 — ABNORMAL HIGH
Creatinine, Ser: 4.48 — ABNORMAL HIGH
Creatinine, Ser: 5.4 — ABNORMAL HIGH
GFR calc Af Amer: 24 — ABNORMAL LOW
GFR calc Af Amer: 25 — ABNORMAL LOW
GFR calc Af Amer: 25 — ABNORMAL LOW
GFR calc Af Amer: 26 — ABNORMAL LOW
GFR calc Af Amer: 29 — ABNORMAL LOW
GFR calc Af Amer: 34 — ABNORMAL LOW
GFR calc Af Amer: 43 — ABNORMAL LOW
GFR calc Af Amer: 17 — ABNORMAL LOW
GFR calc non Af Amer: 20 — ABNORMAL LOW
GFR calc non Af Amer: 21 — ABNORMAL LOW
GFR calc non Af Amer: 21 — ABNORMAL LOW
GFR calc non Af Amer: 21 — ABNORMAL LOW
GFR calc non Af Amer: 24 — ABNORMAL LOW
GFR calc non Af Amer: 28 — ABNORMAL LOW
GFR calc non Af Amer: 10 — ABNORMAL LOW
GFR calc non Af Amer: 13 — ABNORMAL LOW
GFR calc non Af Amer: 20 — ABNORMAL LOW
GFR calc non Af Amer: 29 — ABNORMAL LOW
Glucose, Bld: 121 — ABNORMAL HIGH
Glucose, Bld: 59 — ABNORMAL LOW
Glucose, Bld: 65 — ABNORMAL LOW
Glucose, Bld: 78
Glucose, Bld: 94
Glucose, Bld: 236 — ABNORMAL HIGH
Glucose, Bld: 239 — ABNORMAL HIGH
Glucose, Bld: 252 — ABNORMAL HIGH
Glucose, Bld: 256 — ABNORMAL HIGH
Glucose, Bld: 71
Potassium: 3.8
Potassium: 3.9
Potassium: 3.9
Potassium: 6.1 — ABNORMAL HIGH
Potassium: 6.6
Potassium: 6.8
Potassium: 7.2
Potassium: 4
Potassium: 4.6
Potassium: 5.1
Potassium: 5.7 — ABNORMAL HIGH
Sodium: 134 — ABNORMAL LOW
Sodium: 137
Sodium: 140
Sodium: 141
Sodium: 142
Sodium: 143
Sodium: 145
Sodium: 138
Sodium: 139

## 2011-02-18 LAB — URINE MICROSCOPIC-ADD ON

## 2011-02-18 LAB — URINE CULTURE
Colony Count: NO GROWTH
Special Requests: NEGATIVE

## 2011-02-18 LAB — LIPID PANEL
Cholesterol: 101
LDL Cholesterol: 55
Triglycerides: 45
VLDL: 9

## 2011-02-18 LAB — B-NATRIURETIC PEPTIDE (CONVERTED LAB): Pro B Natriuretic peptide (BNP): 41.9

## 2011-02-18 LAB — POCT CARDIAC MARKERS
CKMB, poc: 9.5
Myoglobin, poc: >500
Operator id: 221061
Troponin i, poc: <0.05

## 2011-02-18 LAB — IRON AND TIBC
Iron: 23 — ABNORMAL LOW
TIBC: 262
UIBC: 239

## 2011-02-18 LAB — ABO/RH: ABO/RH(D): O NEG

## 2011-02-18 LAB — TYPE AND SCREEN
ABO/RH(D): O NEG
Antibody Screen: NEGATIVE

## 2011-02-18 LAB — CULTURE, BLOOD (ROUTINE X 2)
Culture: NO GROWTH
Culture: NO GROWTH

## 2011-02-18 LAB — MAGNESIUM: Magnesium: 2.9 — ABNORMAL HIGH

## 2011-02-18 LAB — FERRITIN: Ferritin: 335 — ABNORMAL HIGH (ref 22–322)

## 2011-02-19 LAB — BASIC METABOLIC PANEL
BUN: 38 — ABNORMAL HIGH
BUN: 76 — ABNORMAL HIGH
CO2: 27
CO2: 29
CO2: 31
Calcium: 7.9 — ABNORMAL LOW
Calcium: 8.2 — ABNORMAL LOW
Chloride: 106
Chloride: 107
Chloride: 110
Creatinine, Ser: 1.29
Creatinine, Ser: 1.44
Creatinine, Ser: 1.52 — ABNORMAL HIGH
Creatinine, Ser: 1.62 — ABNORMAL HIGH
Creatinine, Ser: 1.85 — ABNORMAL HIGH
GFR calc Af Amer: 50 — ABNORMAL LOW
GFR calc Af Amer: 54 — ABNORMAL LOW
GFR calc Af Amer: >60
GFR calc non Af Amer: 47 — ABNORMAL LOW
Glucose, Bld: 111 — ABNORMAL HIGH
Glucose, Bld: 192 — ABNORMAL HIGH
Potassium: 3.6
Potassium: 3.9
Sodium: 143

## 2011-02-19 LAB — CBC
HCT: 29.1 — ABNORMAL LOW
Hemoglobin: 9.6 — ABNORMAL LOW
MCHC: 33.1
MCHC: 33.5
MCHC: 34.2
MCV: 83
MCV: 83.5
MCV: 84.4
Platelets: 346
Platelets: 402 — ABNORMAL HIGH
RBC: 3.1 — ABNORMAL LOW
RBC: 3.26 — ABNORMAL LOW
RBC: 3.27 — ABNORMAL LOW
RBC: 3.45 — ABNORMAL LOW
RDW: 17.1 — ABNORMAL HIGH
RDW: 17.2 — ABNORMAL HIGH
RDW: 17.6 — ABNORMAL HIGH
WBC: 12.9 — ABNORMAL HIGH

## 2011-02-19 LAB — DIFFERENTIAL
Basophils Absolute: 0
Basophils Absolute: 0
Basophils Absolute: 0.1
Basophils Relative: 0
Basophils Relative: 0
Basophils Relative: 0
Eosinophils Absolute: 0.3
Eosinophils Absolute: 0.3
Eosinophils Absolute: 0.4
Eosinophils Relative: 2
Eosinophils Relative: 3
Lymphocytes Relative: 9 — ABNORMAL LOW
Lymphocytes Relative: 9 — ABNORMAL LOW
Lymphs Abs: 1.1
Lymphs Abs: 1.2
Monocytes Absolute: 1.2 — ABNORMAL HIGH
Monocytes Relative: 10
Monocytes Relative: 12
Monocytes Relative: 9
Monocytes Relative: 9
Neutro Abs: 12.5 — ABNORMAL HIGH
Neutro Abs: 15.8 — ABNORMAL HIGH
Neutrophils Relative %: 78 — ABNORMAL HIGH
Neutrophils Relative %: 80 — ABNORMAL HIGH
Neutrophils Relative %: 83 — ABNORMAL HIGH

## 2011-02-19 LAB — KOH PREP

## 2017-05-06 ENCOUNTER — Telehealth: Payer: Self-pay | Admitting: "Endocrinology

## 2017-05-10 NOTE — Telephone Encounter (Signed)
Opened in error
